# Patient Record
Sex: Male | Born: 1955
Health system: Southern US, Community
[De-identification: ages and names within clinical notes are randomized; demographics above are authoritative.]

## PROBLEM LIST (undated history)

## (undated) DIAGNOSIS — E785 Hyperlipidemia, unspecified: Secondary | ICD-10-CM

## (undated) DIAGNOSIS — F419 Anxiety disorder, unspecified: Secondary | ICD-10-CM

---

## 2016-04-10 DIAGNOSIS — H2513 Age-related nuclear cataract, bilateral: Secondary | ICD-10-CM | POA: Insufficient documentation

## 2016-10-07 DIAGNOSIS — F411 Generalized anxiety disorder: Secondary | ICD-10-CM | POA: Insufficient documentation

## 2016-10-07 DIAGNOSIS — G4700 Insomnia, unspecified: Secondary | ICD-10-CM | POA: Insufficient documentation

## 2019-03-01 DIAGNOSIS — E7800 Pure hypercholesterolemia, unspecified: Secondary | ICD-10-CM | POA: Diagnosis not present

## 2019-03-01 DIAGNOSIS — E663 Overweight: Secondary | ICD-10-CM | POA: Diagnosis not present

## 2019-03-01 DIAGNOSIS — R030 Elevated blood-pressure reading, without diagnosis of hypertension: Secondary | ICD-10-CM | POA: Diagnosis not present

## 2019-03-01 DIAGNOSIS — L821 Other seborrheic keratosis: Secondary | ICD-10-CM | POA: Diagnosis not present

## 2019-09-01 DIAGNOSIS — R030 Elevated blood-pressure reading, without diagnosis of hypertension: Secondary | ICD-10-CM | POA: Diagnosis not present

## 2019-09-01 DIAGNOSIS — E7800 Pure hypercholesterolemia, unspecified: Secondary | ICD-10-CM | POA: Diagnosis not present

## 2019-09-01 DIAGNOSIS — F411 Generalized anxiety disorder: Secondary | ICD-10-CM | POA: Diagnosis not present

## 2019-09-01 DIAGNOSIS — E663 Overweight: Secondary | ICD-10-CM | POA: Diagnosis not present

## 2020-03-05 DIAGNOSIS — F411 Generalized anxiety disorder: Secondary | ICD-10-CM | POA: Diagnosis not present

## 2020-03-05 DIAGNOSIS — U071 COVID-19: Secondary | ICD-10-CM | POA: Diagnosis not present

## 2020-03-05 DIAGNOSIS — Z20822 Contact with and (suspected) exposure to covid-19: Secondary | ICD-10-CM | POA: Diagnosis not present

## 2020-03-05 DIAGNOSIS — R030 Elevated blood-pressure reading, without diagnosis of hypertension: Secondary | ICD-10-CM | POA: Diagnosis not present

## 2020-03-05 DIAGNOSIS — I771 Stricture of artery: Secondary | ICD-10-CM | POA: Diagnosis not present

## 2020-03-05 DIAGNOSIS — I6523 Occlusion and stenosis of bilateral carotid arteries: Secondary | ICD-10-CM | POA: Diagnosis not present

## 2020-03-05 DIAGNOSIS — E663 Overweight: Secondary | ICD-10-CM | POA: Diagnosis not present

## 2020-03-05 DIAGNOSIS — E7800 Pure hypercholesterolemia, unspecified: Secondary | ICD-10-CM | POA: Diagnosis not present

## 2020-03-16 DIAGNOSIS — Z1211 Encounter for screening for malignant neoplasm of colon: Secondary | ICD-10-CM | POA: Diagnosis not present

## 2020-03-16 DIAGNOSIS — Z1212 Encounter for screening for malignant neoplasm of rectum: Secondary | ICD-10-CM | POA: Diagnosis not present

## 2020-05-28 ENCOUNTER — Other Ambulatory Visit: Payer: Self-pay

## 2020-05-28 ENCOUNTER — Encounter (HOSPITAL_COMMUNITY): Payer: Self-pay | Admitting: Pediatrics

## 2020-05-28 ENCOUNTER — Observation Stay (HOSPITAL_COMMUNITY): Payer: PPO

## 2020-05-28 ENCOUNTER — Emergency Department (HOSPITAL_COMMUNITY): Payer: PPO

## 2020-05-28 ENCOUNTER — Observation Stay (HOSPITAL_COMMUNITY)
Admission: EM | Admit: 2020-05-28 | Discharge: 2020-05-29 | Disposition: A | Discharge status: Home / Self Care | Payer: PPO | Attending: Student | Admitting: Student

## 2020-05-28 DIAGNOSIS — F419 Anxiety disorder, unspecified: Secondary | ICD-10-CM | POA: Diagnosis present

## 2020-05-28 DIAGNOSIS — I10 Essential (primary) hypertension: Secondary | ICD-10-CM | POA: Diagnosis not present

## 2020-05-28 DIAGNOSIS — H34231 Retinal artery branch occlusion, right eye: Secondary | ICD-10-CM | POA: Diagnosis not present

## 2020-05-28 DIAGNOSIS — F411 Generalized anxiety disorder: Secondary | ICD-10-CM | POA: Insufficient documentation

## 2020-05-28 DIAGNOSIS — Z20822 Contact with and (suspected) exposure to covid-19: Secondary | ICD-10-CM | POA: Insufficient documentation

## 2020-05-28 DIAGNOSIS — I672 Cerebral atherosclerosis: Secondary | ICD-10-CM | POA: Diagnosis not present

## 2020-05-28 DIAGNOSIS — H43811 Vitreous degeneration, right eye: Secondary | ICD-10-CM | POA: Diagnosis not present

## 2020-05-28 DIAGNOSIS — R29818 Other symptoms and signs involving the nervous system: Secondary | ICD-10-CM | POA: Diagnosis not present

## 2020-05-28 DIAGNOSIS — H53131 Sudden visual loss, right eye: Secondary | ICD-10-CM | POA: Diagnosis not present

## 2020-05-28 DIAGNOSIS — H538 Other visual disturbances: Secondary | ICD-10-CM | POA: Diagnosis present

## 2020-05-28 DIAGNOSIS — H5461 Unqualified visual loss, right eye, normal vision left eye: Secondary | ICD-10-CM | POA: Diagnosis not present

## 2020-05-28 DIAGNOSIS — I618 Other nontraumatic intracerebral hemorrhage: Secondary | ICD-10-CM | POA: Diagnosis not present

## 2020-05-28 DIAGNOSIS — Z79899 Other long term (current) drug therapy: Secondary | ICD-10-CM | POA: Diagnosis not present

## 2020-05-28 DIAGNOSIS — Z87891 Personal history of nicotine dependence: Secondary | ICD-10-CM | POA: Insufficient documentation

## 2020-05-28 DIAGNOSIS — E785 Hyperlipidemia, unspecified: Secondary | ICD-10-CM | POA: Diagnosis present

## 2020-05-28 DIAGNOSIS — H35033 Hypertensive retinopathy, bilateral: Secondary | ICD-10-CM | POA: Diagnosis not present

## 2020-05-28 DIAGNOSIS — I6523 Occlusion and stenosis of bilateral carotid arteries: Secondary | ICD-10-CM | POA: Insufficient documentation

## 2020-05-28 HISTORY — DX: Anxiety disorder, unspecified: F41.9

## 2020-05-28 HISTORY — DX: Hyperlipidemia, unspecified: E78.5

## 2020-05-28 LAB — DIFFERENTIAL
Abs Immature Granulocytes: 0.01 10*3/uL (ref 0.00–0.07)
Basophils Absolute: 0 10*3/uL (ref 0.0–0.1)
Basophils Relative: 1 %
Eosinophils Absolute: 0.2 10*3/uL (ref 0.0–0.5)
Eosinophils Relative: 3 %
Immature Granulocytes: 0 %
Lymphocytes Relative: 36 %
Lymphs Abs: 2.4 10*3/uL (ref 0.7–4.0)
Monocytes Absolute: 0.6 10*3/uL (ref 0.1–1.0)
Monocytes Relative: 8 %
Neutro Abs: 3.4 10*3/uL (ref 1.7–7.7)
Neutrophils Relative %: 52 %

## 2020-05-28 LAB — CBC
HCT: 47 % (ref 39.0–52.0)
Hemoglobin: 14.9 g/dL (ref 13.0–17.0)
MCH: 30.6 pg (ref 26.0–34.0)
MCHC: 31.7 g/dL (ref 30.0–36.0)
MCV: 96.5 fL (ref 80.0–100.0)
Platelets: 239 10*3/uL (ref 150–400)
RBC: 4.87 MIL/uL (ref 4.22–5.81)
RDW: 12.4 % (ref 11.5–15.5)
WBC: 6.6 10*3/uL (ref 4.0–10.5)
nRBC: 0 % (ref 0.0–0.2)

## 2020-05-28 LAB — COMPREHENSIVE METABOLIC PANEL
ALT: 27 U/L (ref 0–44)
AST: 21 U/L (ref 15–41)
Albumin: 4.1 g/dL (ref 3.5–5.0)
Alkaline Phosphatase: 52 U/L (ref 38–126)
Anion gap: 12 (ref 5–15)
BUN: 13 mg/dL (ref 8–23)
CO2: 22 mmol/L (ref 22–32)
Calcium: 9.2 mg/dL (ref 8.9–10.3)
Chloride: 105 mmol/L (ref 98–111)
Creatinine, Ser: 1.09 mg/dL (ref 0.61–1.24)
GFR, Estimated: >60 mL/min (ref >60)
Glucose, Bld: 99 mg/dL (ref 70–99)
Potassium: 4.5 mmol/L (ref 3.5–5.1)
Sodium: 139 mmol/L (ref 135–145)
Total Bilirubin: 0.7 mg/dL (ref 0.3–1.2)
Total Protein: 6.9 g/dL (ref 6.5–8.1)

## 2020-05-28 LAB — I-STAT CHEM 8, ED
BUN: 14 mg/dL (ref 8–23)
Calcium, Ion: 1.17 mmol/L (ref 1.15–1.40)
Chloride: 106 mmol/L (ref 98–111)
Creatinine, Ser: 1 mg/dL (ref 0.61–1.24)
Glucose, Bld: 97 mg/dL (ref 70–99)
HCT: 45 % (ref 39.0–52.0)
Hemoglobin: 15.3 g/dL (ref 13.0–17.0)
Potassium: 4.2 mmol/L (ref 3.5–5.1)
Sodium: 141 mmol/L (ref 135–145)
TCO2: 24 mmol/L (ref 22–32)

## 2020-05-28 LAB — PROTIME-INR
INR: 1 (ref 0.8–1.2)
Prothrombin Time: 12.6 seconds (ref 11.4–15.2)

## 2020-05-28 LAB — APTT: aPTT: 28 seconds (ref 24–36)

## 2020-05-28 LAB — SARS CORONAVIRUS 2 (TAT 6-24 HRS): SARS Coronavirus 2: NEGATIVE

## 2020-05-28 MED ORDER — STROKE: EARLY STAGES OF RECOVERY BOOK: Freq: Once | Status: DC

## 2020-05-28 MED ORDER — SENNOSIDES-DOCUSATE SODIUM 8.6-50 MG PO TABS: 1.0000 | ORAL_TABLET | Freq: Every evening | ORAL | Status: DC | PRN

## 2020-05-28 MED ORDER — SODIUM CHLORIDE 0.9% FLUSH
3.0000 mL | Freq: Once | INTRAVENOUS | Status: AC
  Administered 2020-05-28: 3 mL via INTRAVENOUS

## 2020-05-28 MED ORDER — ACETAMINOPHEN 325 MG PO TABS: 650.0000 mg | ORAL_TABLET | ORAL | Status: DC | PRN

## 2020-05-28 MED ORDER — ACETAMINOPHEN 160 MG/5ML PO SOLN: 650.0000 mg | ORAL | Status: DC | PRN

## 2020-05-28 MED ORDER — ROSUVASTATIN CALCIUM 5 MG PO TABS: 10.0000 mg | ORAL_TABLET | Freq: Every day | ORAL | Status: DC

## 2020-05-28 MED ORDER — ASPIRIN EC 81 MG PO TBEC
81.0000 mg | DELAYED_RELEASE_TABLET | Freq: Every day | ORAL | Status: DC
  Administered 2020-05-29: 81 mg via ORAL
  Filled 2020-05-28: qty 1

## 2020-05-28 MED ORDER — BUPROPION HCL ER (XL) 150 MG PO TB24
300.0000 mg | ORAL_TABLET | Freq: Every day | ORAL | Status: DC
  Administered 2020-05-29: 300 mg via ORAL
  Filled 2020-05-28: qty 2

## 2020-05-28 MED ORDER — ENOXAPARIN SODIUM 40 MG/0.4ML ~~LOC~~ SOLN
40.0000 mg | SUBCUTANEOUS | Status: DC
  Administered 2020-05-28: 40 mg via SUBCUTANEOUS
  Filled 2020-05-28: qty 0.4

## 2020-05-28 MED ORDER — ACETAMINOPHEN 650 MG RE SUPP: 650.0000 mg | RECTAL | Status: DC | PRN

## 2020-05-28 MED ORDER — CLOPIDOGREL BISULFATE 75 MG PO TABS
75.0000 mg | ORAL_TABLET | Freq: Every day | ORAL | Status: DC
  Administered 2020-05-29 (×2): 75 mg via ORAL
  Filled 2020-05-28 (×2): qty 1

## 2020-05-28 MED ORDER — ATORVASTATIN CALCIUM 40 MG PO TABS
40.0000 mg | ORAL_TABLET | Freq: Every day | ORAL | Status: DC
  Administered 2020-05-29: 40 mg via ORAL
  Filled 2020-05-28: qty 1

## 2020-05-28 NOTE — ED Provider Notes (Signed)
[facility] EMERGENCY DEPARTMENT Provider Note   CSN: [id number] Arrival date & time: 05/28/20  1222     History Chief Complaint  Patient presents with  . Visual Field Change    JUANPABLO CIRESI is a 65 y.o. male.  The history is provided by the patient, medical records and the spouse.   ISSAIAH SEABROOKS is a 65 y.o. male who presents to the Emergency Department complaining of visual field change.  He presents to the ED accompanied by his wife for visual field change that started Friday night.  He reports loss of vision in the bottom half of his right eye that is constant in nature. He did not mention this change to his wife until today due to going to boar hunting trip in [place]. Today he went to see his ophthalmologist, Dr. Porfirio Mylar, who referred him to [facility] retina. There he was seen by Dr. Lelan Pons, who diagnosed him with a branch retinal artery occlusion of the right eye and he was referred to the emergency department for further evaluation.  He denies any headache, numbness, weakness, fevers, cough, nausea, vomiting. He has a history of hyperlipidemia. He has been vaccinated for COVID-19 but not boosted. He does take 81 mg aspirin daily.    History reviewed. No pertinent past medical history.  There are no problems to display for this patient.   History reviewed. No pertinent surgical history.     No family history on file.     Home Medications Prior to Admission medications   Not on File    Allergies    Patient has no known allergies.  Review of Systems   Review of Systems  All other systems reviewed and are negative.   Physical Exam Updated Vital Signs BP (!) 158/64   Pulse (!) 59   Temp 98.8 F (37.1 C)   Resp 17   SpO2 97%   Physical Exam Vitals and nursing note reviewed.  Constitutional:      Appearance: He is well-developed and well-nourished.  HENT:     Head: Normocephalic and atraumatic.  Cardiovascular:     Rate  and Rhythm: Normal rate and regular rhythm.     Heart sounds: No murmur heard.   Pulmonary:     Effort: Pulmonary effort is normal. No respiratory distress.     Breath sounds: Normal breath sounds.  Abdominal:     Palpations: Abdomen is soft.     Tenderness: There is no abdominal tenderness. There is no guarding or rebound.  Musculoskeletal:        General: No tenderness or edema.  Skin:    General: Skin is warm and dry.  Neurological:     Mental Status: He is alert and oriented to person, place, and time.     Comments: No asymmetry of facial movements. There is decreased vision in the lower visual fields in the right eye. EOM I. Pupils equal round and reactive. Five out of five strength in all four extremities with sensation light touch intact in all four extremities.  Psychiatric:        Mood and Affect: Mood and affect normal.        Behavior: Behavior normal.     ED Results / Procedures / Treatments   Labs (all labs ordered are listed, but only abnormal results are displayed) Labs Reviewed  PROTIME-INR  APTT  CBC  DIFFERENTIAL  COMPREHENSIVE METABOLIC PANEL  I-STAT CHEM 8, ED  CBG MONITORING, ED  EKG None  Radiology CT HEAD WO CONTRAST  Result Date: 05/28/2020 CLINICAL DATA:  Right-sided vision loss for several days EXAM: CT HEAD WITHOUT CONTRAST TECHNIQUE: Contiguous axial images were obtained from the base of the skull through the vertex without intravenous contrast. COMPARISON:  None. FINDINGS: Brain: No evidence of acute infarction, hemorrhage, hydrocephalus, extra-axial collection or mass lesion/mass effect. Vascular: No hyperdense vessel or unexpected calcification. Skull: Normal. Negative for fracture or focal lesion. Sinuses/Orbits: No acute finding. Other: None. IMPRESSION: No acute intracranial abnormality noted. Electronically Signed   By: Alcide Clever M.D.   On: 05/28/2020 13:58    Procedures Procedures   Medications Ordered in ED Medications  sodium  chloride flush (NS) 0.9 % injection 3 mL (has no administration in time range)    ED Course  I have reviewed the triage vital signs and the nursing notes.  Pertinent labs & imaging results that were available during my care of the patient were reviewed by me and considered in my medical decision making (see chart for details).    MDM Rules/Calculators/A&P                         patient referred to the emergency department by ophthalmology for acute branch retinal artery occlusion that occurred on Friday. Discussed with Dr. Otelia Limes with neurology, who recommends admission for stroke workup. Patient updated of treatment plan. Hospitalist consulted for admission.  Final Clinical Impression(s) / ED Diagnoses Final diagnoses:  Retinal artery branch occlusion of right eye    Rx / DC Orders ED Discharge Orders    None       Tilden Fossa, MD 05/28/20 2010

## 2020-05-28 NOTE — H&P (Addendum)
History and Physical    Fred Hammond NLG:[id number] DOB: [birth year] DOA: 05/28/2020  PCP: Shellia Cleverly, PA  Patient coming from: Ophthalmology office  I have personally briefly reviewed patient's old medical records in [facility]  Chief Complaint: Right visual field change  HPI: Fred Hammond is a 65 y.o. male with medical history significant for hyperlipidemia and anxiety who presents to the ED for evaluation of right visual field change.  Patient states he was in his usual state of health when on 05/25/2020 he noticed a new loss of vision in his right lower visual field.  He did not really think much of it at the time.  The following day he went boar hunting and when he raised his gun up to look through the scope he again noticed that he had a loss of vision in the right lower field which he describes as a circular area that is blurred out.  He has not had any associated pain, headache, nausea, vomiting, chest pain, palpitations, dyspnea, numbness/tingling, weakness in his arms or legs.  He went to his regular ophthalmologist earlier today who sent him to [place] retina.  He was seen by Dr. Lelan Pons who diagnosed him with a branch retinal artery occlusion of the right eye and referred him to the ED for further evaluation.  Patient states he is a former smoker, quit in 2010.  Reports rare alcohol use denies any illicit drug use.  He reports a history of hypertension and diabetes in his mother.  His current medications are rosuvastatin 10 mg daily, aspirin 81 mg daily, bupropion, and montelukast.  ED Course:  Initial vitals showed BP 116/88, pulse 59, RR 16, SPO2 96% on room air.  Labs show WBC 6.6, hemoglobin 14.9, platelets 239,000, sodium 139, potassium 4.5, bicarb 22, BUN 13, creatinine 1.09, LFTs within normal limits.  CT head without contrast was negative for acute intracranial abnormality.  Per EDP, case was discussed with on-call neurology, Dr. Otelia Limes, who recommended  medical admission for stroke work-up.  The hospitalist service was consulted to admit for further evaluation and management.  Review of Systems: All systems reviewed and are negative except as documented in history of present illness above.   Past Medical History:  Diagnosis Date  . Anxiety   . Hyperlipidemia     History reviewed. No pertinent surgical history.  Social History:  reports that he has quit smoking. He has never used smokeless tobacco. He reports previous alcohol use. He reports that he does not use drugs.  No Known Allergies  Family History  Problem Relation Age of Onset  . Hypertension Mother   . Diabetes Mother      Prior to Admission medications   Not on File    Physical Exam: Vitals:   05/28/20 1930 05/28/20 1945 05/28/20 2000 05/28/20 2040  BP: (!) 166/73 (!) 166/72 (!) 148/76 (!) 169/72  Pulse: (!) 57 (!) 51 (!) 51 (!) 58  Resp: 14 13 17 16   Temp:      SpO2: 99% 100% 98% 99%   Constitutional: NAD, calm, comfortable Eyes: PERRL, EOMI, lids and conjunctivae normal.  Visual fields largely intact exception of scotoma right lower visual field. ENMT: Mucous membranes are moist. Posterior pharynx clear of any exudate or lesions.Normal dentition.  Neck: normal, supple, no masses. Respiratory: clear to auscultation bilaterally, no wheezing, no crackles. Normal respiratory effort. No accessory muscle use.  Cardiovascular: Regular rate and rhythm, no murmurs / rubs / gallops. No extremity edema.  2+ pedal pulses. Abdomen: no tenderness, no masses palpated. No hepatosplenomegaly. Bowel sounds positive.  Musculoskeletal: no clubbing / cyanosis. No joint deformity upper and lower extremities. Good ROM, no contractures. Normal muscle tone.  Skin: no rashes, lesions, ulcers. No induration Neurologic: CN 2-12 grossly intact. Sensation intact. Strength 5/5 in all 4.  Psychiatric: Normal judgment and insight. Alert and oriented x 3. Normal mood.   Labs on Admission:  I have personally reviewed following labs and imaging studies  CBC: Recent Labs  Lab 05/28/20 1256 05/28/20 1317  WBC  --  6.6  NEUTROABS  --  3.4  HGB 15.3 14.9  HCT 45.0 47.0  MCV  --  96.5  PLT  --  239   Basic Metabolic Panel: Recent Labs  Lab 05/28/20 1256 05/28/20 1317  NA 141 139  K 4.2 4.5  CL 106 105  CO2  --  22  GLUCOSE 97 99  BUN 14 13  CREATININE 1.00 1.09  CALCIUM  --  9.2   GFR: CrCl cannot be calculated (Unknown ideal weight.). Liver Function Tests: Recent Labs  Lab 05/28/20 1317  AST 21  ALT 27  ALKPHOS 52  BILITOT 0.7  PROT 6.9  ALBUMIN 4.1   No results for input(s): LIPASE, AMYLASE in the last 168 hours. No results for input(s): AMMONIA in the last 168 hours. Coagulation Profile: Recent Labs  Lab 05/28/20 1317  INR 1.0   Cardiac Enzymes: No results for input(s): CKTOTAL, CKMB, CKMBINDEX, TROPONINI in the last 168 hours. BNP (last 3 results) No results for input(s): PROBNP in the last 8760 hours. HbA1C: No results for input(s): HGBA1C in the last 72 hours. CBG: No results for input(s): GLUCAP in the last 168 hours. Lipid Profile: No results for input(s): CHOL, HDL, LDLCALC, TRIG, CHOLHDL, LDLDIRECT in the last 72 hours. Thyroid Function Tests: No results for input(s): TSH, T4TOTAL, FREET4, T3FREE, THYROIDAB in the last 72 hours. Anemia Panel: No results for input(s): VITAMINB12, FOLATE, FERRITIN, TIBC, IRON, RETICCTPCT in the last 72 hours. Urine analysis: No results found for: COLORURINE, APPEARANCEUR, LABSPEC, PHURINE, GLUCOSEU, HGBUR, BILIRUBINUR, KETONESUR, PROTEINUR, UROBILINOGEN, NITRITE, LEUKOCYTESUR  Radiological Exams on Admission: CT HEAD WO CONTRAST  Result Date: 05/28/2020 CLINICAL DATA:  Right-sided vision loss for several days EXAM: CT HEAD WITHOUT CONTRAST TECHNIQUE: Contiguous axial images were obtained from the base of the skull through the vertex without intravenous contrast. COMPARISON:  None. FINDINGS: Brain: No  evidence of acute infarction, hemorrhage, hydrocephalus, extra-axial collection or mass lesion/mass effect. Vascular: No hyperdense vessel or unexpected calcification. Skull: Normal. Negative for fracture or focal lesion. Sinuses/Orbits: No acute finding. Other: None. IMPRESSION: No acute intracranial abnormality noted. Electronically Signed   By: Alcide Clever M.D.   On: 05/28/2020 13:58    EKG: Personally reviewed. Sinus rhythm without acute ischemic changes.  No prior for comparison.  Assessment/Plan Principal Problem:   Branch retinal artery occlusion of right eye Active Problems:   Acute loss of vision, right   Anxiety   Hyperlipidemia  Fred Hammond is a 65 y.o. male with medical history significant for hyperlipidemia and anxiety who is admitted for CVA work-up in setting of branch retinal artery occlusion diagnosis.  Acute right visual field deficit due to branch retinal artery occlusion: Diagnosed with branch retinal artery occlusion by ophthalmology, Dr. Lelan Pons, as an outpatient and sent to ED for CVA work-up. -Obtain MRI brain without contrast -CTA head/neck -Echocardiogram -Continue aspirin 81 mg daily -Add Plavix 75 mg daily -Check A1c, lipid  panel -Rosuvastatin switch to atorvastatin 40 mg daily -Monitor on telemetry -Stroke team to follow -Follow-up with ophthalmology as an outpatient  Hyperlipidemia: Continue atorvastatin.  Generalized anxiety disorder: Continue bupropion.  DVT prophylaxis: Lovenox Code Status: DNR, confirmed with patient Family Communication: Discussed with patient, he has discussed with family Disposition Plan: From home and likely discharge to home pending further CVA work-up Consults called: Neurology Level of care: Telemetry Medical Admission status:  Status is: Observation  The patient remains OBS appropriate and will d/c before 2 midnights.  Dispo: The patient is from: Home              Anticipated d/c is to: Home               Anticipated d/c date is: 1 day              Patient currently is not medically stable to d/c.   Difficult to place patient No   Darreld Mclean MD [facility]  If 7PM-7AM, please contact night-coverage [email or web address]  05/28/2020, 10:36 PM

## 2020-05-28 NOTE — Consult Note (Signed)
NEURO HOSPITALIST CONSULT NOTE   Requestig physician: Dr. Allena Katz  Reason for Consult: Acute right monocular partial vision loss  History obtained from:   Patient and Chart     HPI:                                                                                                                                          Fred Hammond is an 65 y.o. male with a PMHx of HLD who experienced partial vision loss in his right eye on Friday; the vision loss was central and in the lower half of his field of vision. He had already made plans to [place] in [facility] for bo[place]-hunting, so he decided not to be seen by a physician initially. However, when he tried to hunt using his right eye (his hunting eye) he was completely disabled regarding this. He then went to see an Ophthalmologist, who diagnosed him with a right BRAO. He was then sent to the ED for stroke work up. He has not had any confusion, facial droop, aphasia, dysarthria, dizziness, weakness or sensory loss. He has no prior history of stroke. He states that he takes ASA regularly.   Past Medical History:  Diagnosis Date  . Anxiety   . Hyperlipidemia     History reviewed. No pertinent surgical history.  Family History  Problem Relation Age of Onset  . Hypertension Mother   . Diabetes Mother               Social History:  reports that he has quit smoking. He has never used smokeless tobacco. He reports previous alcohol use. He reports that he does not use drugs.  No Known Allergies  MEDICATIONS:                                                                                                                     ASA QD   ROS:  As per HPI. Comprehensive ROS otherwise negative.    Blood pressure (!) 169/72, pulse (!) 58, temperature 98.8 F (37.1 C), resp. rate 16, SpO2 99  %.   General Examination:                                                                                                       Physical Exam  HEENT-  [place]/AT   Lungs- Respirations unlabored Extremities- No edema  Neurological Examination Mental Status: Alert, fully oriented, thought content appropriate.  Speech fluent without evidence of aphasia.  Able to follow all commands without difficulty. Cranial Nerves: II: Pupils 2 mm and equally reactive bilaterally. Unable to adequately visualize fundi with ophthalmoscope due to small pupils. Testing of visual fields reveals central scotoma below the horizontal meridian on the right. Left eye visual fields are normal.  III,IV, VI: No ptosis. EOMI. No nystagmus.   V,VII: Smile symmetric, facial temp sensation equal bilaterally VIII: hearing intact to voice IX,X: No hypophonia XI: Symmetric XII: Midline tongue extension Motor: Right : Upper extremity   5/5    Left:     Upper extremity   5/5  Lower extremity   5/5     Lower extremity   5/5 No pronator drift Sensory: Temp and light touch sensation intact throughout, bilaterally. No extinction to DSS.  Deep Tendon Reflexes: 2+ bilateral brachioradialis and biceps. 3+ bilateral patellae. Toes downgoing bilaterally.  Cerebellar: No ataxia with FNF bilaterally  Gait: Deferred   Lab Results: Basic Metabolic Panel: Recent Labs  Lab 05/28/20 1256 05/28/20 1317  NA 141 139  K 4.2 4.5  CL 106 105  CO2  --  22  GLUCOSE 97 99  BUN 14 13  CREATININE 1.00 1.09  CALCIUM  --  9.2    CBC: Recent Labs  Lab 05/28/20 1256 05/28/20 1317  WBC  --  6.6  NEUTROABS  --  3.4  HGB 15.3 14.9  HCT 45.0 47.0  MCV  --  96.5  PLT  --  239    Cardiac Enzymes: No results for input(s): CKTOTAL, CKMB, CKMBINDEX, TROPONINI in the last 168 hours.  Lipid Panel: No results for input(s): CHOL, TRIG, HDL, CHOLHDL, VLDL, LDLCALC in the last 168 hours.  Imaging: CT HEAD WO CONTRAST  Result Date:  05/28/2020 CLINICAL DATA:  Right-sided vision loss for several days EXAM: CT HEAD WITHOUT CONTRAST TECHNIQUE: Contiguous axial images were obtained from the base of the skull through the vertex without intravenous contrast. COMPARISON:  None. FINDINGS: Brain: No evidence of acute infarction, hemorrhage, hydrocephalus, extra-axial collection or mass lesion/mass effect. Vascular: No hyperdense vessel or unexpected calcification. Skull: Normal. Negative for fracture or focal lesion. Sinuses/Orbits: No acute finding. Other: None. IMPRESSION: No acute intracranial abnormality noted. Electronically Signed   By: Alcide Clever M.D.   On: 05/28/2020 13:58    Assessment: 65 year old male presenting with right branch retinal artery occlusion (BRAO) 1.Exam reveals central scotoma of right eye. Vision in left eye is normal. DDx for underlying etiology of BRAO includes carotid atherosclerosis, cardiac valvular vegetation, large  PFO and intermittent atrial fibrillation 2. CT head without acute intracranial abnormality 3. Currently taking ASA as an outpatient.   Recommendations: 1. Add Plavix to ASA 2. Full stroke work up to include MRI brain, CTA of head and neck, TTE and cardiac telemetry 3. If above work up is negative, will most likely need loop recorder placement 4. Switch rosuvastatin to atorvastatin 40 mg po qd.  5. Will need Ophthalmology outpatient follow up 6. Stroke Team to follow in AM.     Electronically signed: Dr. Caryl Pina 05/28/2020, 10:09 PM

## 2020-05-28 NOTE — ED Triage Notes (Signed)
Reported partial vision loss since Friday, and was referred by ophthalmology for CVA work up. Denies other symptoms, A&ox4; ambulatory.

## 2020-05-29 ENCOUNTER — Other Ambulatory Visit: Payer: Self-pay | Admitting: Physician Assistant

## 2020-05-29 ENCOUNTER — Encounter: Payer: Self-pay | Admitting: *Deleted

## 2020-05-29 ENCOUNTER — Observation Stay (HOSPITAL_BASED_OUTPATIENT_CLINIC_OR_DEPARTMENT_OTHER): Payer: PPO

## 2020-05-29 DIAGNOSIS — I6523 Occlusion and stenosis of bilateral carotid arteries: Secondary | ICD-10-CM

## 2020-05-29 DIAGNOSIS — I672 Cerebral atherosclerosis: Secondary | ICD-10-CM | POA: Diagnosis not present

## 2020-05-29 DIAGNOSIS — H349 Unspecified retinal vascular occlusion: Secondary | ICD-10-CM | POA: Diagnosis not present

## 2020-05-29 DIAGNOSIS — E785 Hyperlipidemia, unspecified: Secondary | ICD-10-CM | POA: Diagnosis not present

## 2020-05-29 DIAGNOSIS — H53131 Sudden visual loss, right eye: Secondary | ICD-10-CM

## 2020-05-29 DIAGNOSIS — F419 Anxiety disorder, unspecified: Secondary | ICD-10-CM | POA: Diagnosis not present

## 2020-05-29 DIAGNOSIS — H34231 Retinal artery branch occlusion, right eye: Secondary | ICD-10-CM

## 2020-05-29 DIAGNOSIS — H5461 Unqualified visual loss, right eye, normal vision left eye: Secondary | ICD-10-CM | POA: Diagnosis not present

## 2020-05-29 DIAGNOSIS — R29818 Other symptoms and signs involving the nervous system: Secondary | ICD-10-CM | POA: Diagnosis not present

## 2020-05-29 DIAGNOSIS — I618 Other nontraumatic intracerebral hemorrhage: Secondary | ICD-10-CM | POA: Diagnosis not present

## 2020-05-29 LAB — LIPID PANEL
Cholesterol: 154 mg/dL (ref 0–200)
HDL: 36 mg/dL — ABNORMAL LOW (ref >40)
LDL Cholesterol: 71 mg/dL (ref 0–99)
Total CHOL/HDL Ratio: 4.3 RATIO
Triglycerides: 235 mg/dL — ABNORMAL HIGH (ref <150)
VLDL: 47 mg/dL — ABNORMAL HIGH (ref 0–40)

## 2020-05-29 LAB — HIV ANTIBODY (ROUTINE TESTING W REFLEX): HIV Screen 4th Generation wRfx: NONREACTIVE

## 2020-05-29 LAB — HEMOGLOBIN A1C
Hgb A1c MFr Bld: 5.9 % — ABNORMAL HIGH (ref 4.8–5.6)
Mean Plasma Glucose: 122.63 mg/dL

## 2020-05-29 LAB — ECHOCARDIOGRAM COMPLETE
Area-P 1/2: 2.24 cm2
S' Lateral: 3.7 cm

## 2020-05-29 LAB — CK: Total CK: 188 U/L (ref 49–397)

## 2020-05-29 MED ORDER — IOHEXOL 350 MG/ML SOLN
80.0000 mL | Freq: Once | INTRAVENOUS | Status: AC | PRN
  Administered 2020-05-29: 80 mL via INTRAVENOUS

## 2020-05-29 MED ORDER — ROSUVASTATIN CALCIUM 20 MG PO TABS: 20.0000 mg | ORAL_TABLET | Freq: Every day | ORAL | 1 refills | Status: AC

## 2020-05-29 MED ORDER — CLOPIDOGREL BISULFATE 75 MG PO TABS: 75.0000 mg | ORAL_TABLET | Freq: Every day | ORAL | 0 refills | Status: DC

## 2020-05-29 NOTE — ED Notes (Signed)
Pt cleared on Vascular side of things

## 2020-05-29 NOTE — Progress Notes (Signed)
Pt and his wife are in agreement that pt is moving well, and has no issues to have PT work with for now.  He and his wife are aware that if he notices weakness then he will ask nursing to send PT back.  Discharge PT for now.  05/29/20 1200  PT Visit Information  Reason Eval/Treat Not Completed Other (comment)    Samul Dada, PT MS Acute Rehab Dept. Number: [facility] [id number] and [facility] [phone]

## 2020-05-29 NOTE — Progress Notes (Signed)
  Echocardiogram 2D Echocardiogram has been performed.  Sira Adsit G Takari Lundahl 05/29/2020, 8:30 AM

## 2020-05-29 NOTE — Progress Notes (Signed)
Patient ID: Fred Hammond, male   DOB: Sep 25, 1955, 65 y.o.   MRN: [id number] Patient enrolled for [employer] to ship a 30 day cardiac event monitor to his home. Letter with instructions mailed to patient.

## 2020-05-29 NOTE — Progress Notes (Signed)
Carotid duplex has been completed.   Preliminary results in CV Proc.   Blanch Media 05/29/2020 11:00 AM

## 2020-05-29 NOTE — ED Notes (Addendum)
PT DOES NOT WANT TO BE DNR. DR GONFA NOTIFIED.

## 2020-05-29 NOTE — ED Notes (Signed)
Lunch Tray Ordered @ 1036. 

## 2020-05-29 NOTE — Progress Notes (Signed)
e

## 2020-05-29 NOTE — Discharge Summary (Signed)
Physician Discharge Summary  [name] FXO:[id number] DOB: 1956/02/08 DOA: 05/28/2020  PCP: Shellia Cleverly, PA  Admit date: 05/28/2020 Discharge date: 05/29/2020  Admitted From: Home Disposition: Home  Recommendations for Outpatient Follow-up:  1. Follow ups as below. 2. Please obtain CBC/BMP/Mag at follow up 3. Please follow up on the following pending results: None  Home Health: None required Equipment/Devices: None required  Discharge Condition: Stable CODE STATUS: Full code   Follow-up Information    Shellia Cleverly, PA. Schedule an appointment as soon as possible for a visit in 1 week(s).   Specialty: General Practice Contact information: [address] [place] [address] [phone]                Hospital Course: 65 year old male with PMH of anxiety and HLD presenting with acute visual loss in the right lower visual field.  He was initially seen by his ophthalmologist and sent to [facility] retina.  He was diagnosed with right retinal artery branch occlusion, and directed to ED for further evaluation.  In the ED, vitals and basic labs within normal.  CT head without contrast.  CTA neck and head without intracranial arterial occlusion or high-grade stenosis but bilateral carotid bifurcation atherosclerosis.  Right carotid Doppler with no significant stenosis.  CTA neck and carotid Doppler reviewed by vascular surgery, Dr. Edilia Bo who recommended outpatient follow-up in 3 months with repeat carotid Doppler.  MRI brain and echocardiogram without significant finding.  TC 154.  LDL HDL 36.  LDL 71.  TG 235.  A1c 5.9%.  Neurology "sent message to EP" for event monitor.  He has been cleared for discharge on Plavix and aspirin for 3 weeks followed by aspirin alone.  We also increased his Crestor from 10 to 20 mg daily.  See individual problem list below for more on hospital course.  Discharge Diagnoses:  Right retinal artery branch occlusion with vision loss in the right  lower visual field-work-up as above. -Plavix and aspirin for 3 weeks followed by aspirin alone -Increase Crestor to 20 mg daily -Event monitor by EP outpatient-neurology contacting EP -Outpatient follow-up with vascular surgery in 3 months  Bilateral carotid artery atherosclerosis -Management as above  Hyperlipidemia: Lipid panel as above. -Increase Crestor to 20 mg daily  Generalized anxiety?  Appears he is on Wellbutrin which might not be a great choice -Defer to his PCP   There is no height or weight on file to calculate BMI.            Discharge Exam: Vitals:   05/29/20 1200 05/29/20 1400  BP: 136/76 (!) 161/83  Pulse: 60 66  Resp: 15 (!) 21  Temp:    SpO2: 95% 96%    GENERAL: No apparent distress.  Nontoxic. HEENT: MMM.  Hearing grossly intact.  Right lower visual defect NECK: Supple.  No apparent JVD.  RESP:  No IWOB.  Fair aeration bilaterally. CVS:  RRR. Heart sounds normal.  ABD/GI/GU: Bowel sounds present. Soft. Non tender.  MSK/EXT:  Moves extremities. No apparent deformity. No edema.  SKIN: no apparent skin lesion or wound NEURO: Awake, alert and oriented appropriately.  Right lower visual defect.  Otherwise intact. PSYCH: Calm. Normal affect.  Discharge Instructions  Discharge Instructions    Diet - low sodium heart healthy   Complete by: As directed    Discharge instructions   Complete by: As directed    It has been a pleasure taking care of you!  You were hospitalized  due  to vision loss in the right lower visual field likely due to occlusion of of the blood vessels to the corresponding area in your eye.  We have increased your cholesterol medication to reduce your future risk of such problems or stroke.  We have also started you on Plavix (blood thinner) that you take along low-dose aspirin over the next 3 weeks.  Please follow-up with your primary care doctor in 1 to 2 weeks. Cardiology and vascular surgery team will reach out to you to arrange  outpatient follow-up for further evaluation of your heart and the blood vessels in your neck.    Take care,   Increase activity slowly   Complete by: As directed      Allergies as of 05/29/2020      Reactions   Amoxicillin Other (See Comments)      Medication List    STOP taking these medications   ibuprofen 200 MG tablet Commonly known as: ADVIL     TAKE these medications   acetaminophen 500 MG tablet Commonly known as: TYLENOL Take 500 mg by mouth every 6 (six) hours as needed for mild pain or headache.   aspirin 81 MG EC tablet Take 81 mg by mouth daily.   buPROPion 300 MG 24 hr tablet Commonly known as: WELLBUTRIN XL Take 300 mg by mouth daily.   clopidogrel 75 MG tablet Commonly known as: PLAVIX Take 1 tablet (75 mg total) by mouth daily. Start taking on: May 30, 2020   montelukast 10 MG tablet Commonly known as: SINGULAIR Take 10 mg by mouth daily.   rosuvastatin 20 MG tablet Commonly known as: CRESTOR Take 1 tablet (20 mg total) by mouth daily. What changed:   medication strength  how much to take       Consultations:  Neurology  Electrophysiology  Vascular surgery  Procedures/Studies:  2D Echo on 05/29/2020 1. Left ventricular ejection fraction, by estimation, is 55 to 60%. The  left ventricle has normal function. The left ventricle has no regional  wall motion abnormalities. Left ventricular diastolic parameters are  consistent with Grade I diastolic  dysfunction (impaired relaxation).  2. Right ventricular systolic function is normal. The right ventricular  size is normal.  3. The mitral valve is normal in structure. No evidence of mitral valve  regurgitation. No evidence of mitral stenosis. Moderate mitral annular  calcification.  4. The aortic valve is tricuspid. Aortic valve regurgitation is not  visualized. Mild aortic valve sclerosis is present, with no evidence of  aortic valve stenosis.  5. The inferior vena cava is  normal in size with greater than 50%  respiratory variability, suggesting right atrial pressure of 3 mmHg.    CT ANGIO HEAD W OR WO CONTRAST  Result Date: 05/29/2020 CLINICAL DATA:  Right eye vision loss EXAM: CT ANGIOGRAPHY HEAD AND NECK TECHNIQUE: Multidetector CT imaging of the head and neck was performed using the standard protocol during bolus administration of intravenous contrast. Multiplanar CT image reconstructions and MIPs were obtained to evaluate the vascular anatomy. Carotid stenosis measurements (when applicable) are obtained utilizing NASCET criteria, using the distal internal carotid diameter as the denominator. CONTRAST:  80mL OMNIPAQUE IOHEXOL 350 MG/ML SOLN COMPARISON:  None. FINDINGS: CTA NECK FINDINGS SKELETON: There is no bony spinal canal stenosis. No lytic or blastic lesion. OTHER NECK: Normal pharynx, larynx and major salivary glands. No cervical lymphadenopathy. Unremarkable thyroid gland. UPPER CHEST: No pneumothorax or pleural effusion. No nodules or masses. AORTIC ARCH: There is calcific atherosclerosis of  the aortic arch. There is no aneurysm, dissection or hemodynamically significant stenosis of the visualized portion of the aorta. Conventional 3 vessel aortic branching pattern. The visualized proximal subclavian arteries are widely patent. RIGHT CAROTID SYSTEM: No dissection, occlusion or aneurysm. There is mixed density atherosclerosis extending into the proximal ICA, resulting in less than 50% stenosis. LEFT CAROTID SYSTEM: No dissection, occlusion or aneurysm. Mild atherosclerotic calcification at the carotid bifurcation without hemodynamically significant stenosis. VERTEBRAL ARTERIES: Right dominant configuration. Both origins are clearly patent. There is no dissection, occlusion or flow-limiting stenosis to the skull base (V1-V3 segments). CTA HEAD FINDINGS POSTERIOR CIRCULATION: --Vertebral arteries: Normal V4 segments. --Inferior cerebellar arteries: Normal. --Basilar  artery: Normal. --Superior cerebellar arteries: Normal. --Posterior cerebral arteries (PCA): Normal. ANTERIOR CIRCULATION: --Intracranial internal carotid arteries: Atherosclerotic calcification of the internal carotid arteries at the skull base without hemodynamically significant stenosis. --Anterior cerebral arteries (ACA): Normal. Both A1 segments are present. Patent anterior communicating artery (a-comm). --Middle cerebral arteries (MCA): Normal. VENOUS SINUSES: As permitted by contrast timing, patent. ANATOMIC VARIANTS: Fetal origin of the right posterior cerebral artery. Review of the MIP images confirms the above findings. IMPRESSION: 1. No intracranial arterial occlusion or high-grade stenosis. 2. Bilateral carotid bifurcation atherosclerosis without hemodynamically significant stenosis by NASCET criteria. Aortic Atherosclerosis (ICD10-I70.0). Electronically Signed   By: Deatra Robinson M.D.   On: 05/29/2020 00:30   CT HEAD WO CONTRAST  Result Date: 05/28/2020 CLINICAL DATA:  Right-sided vision loss for several days EXAM: CT HEAD WITHOUT CONTRAST TECHNIQUE: Contiguous axial images were obtained from the base of the skull through the vertex without intravenous contrast. COMPARISON:  None. FINDINGS: Brain: No evidence of acute infarction, hemorrhage, hydrocephalus, extra-axial collection or mass lesion/mass effect. Vascular: No hyperdense vessel or unexpected calcification. Skull: Normal. Negative for fracture or focal lesion. Sinuses/Orbits: No acute finding. Other: None. IMPRESSION: No acute intracranial abnormality noted. Electronically Signed   By: Alcide Clever M.D.   On: 05/28/2020 13:58   CT ANGIO NECK W OR WO CONTRAST  Result Date: 05/29/2020 CLINICAL DATA:  Right eye vision loss EXAM: CT ANGIOGRAPHY HEAD AND NECK TECHNIQUE: Multidetector CT imaging of the head and neck was performed using the standard protocol during bolus administration of intravenous contrast. Multiplanar CT image reconstructions  and MIPs were obtained to evaluate the vascular anatomy. Carotid stenosis measurements (when applicable) are obtained utilizing NASCET criteria, using the distal internal carotid diameter as the denominator. CONTRAST:  29mL OMNIPAQUE IOHEXOL 350 MG/ML SOLN COMPARISON:  None. FINDINGS: CTA NECK FINDINGS SKELETON: There is no bony spinal canal stenosis. No lytic or blastic lesion. OTHER NECK: Normal pharynx, larynx and major salivary glands. No cervical lymphadenopathy. Unremarkable thyroid gland. UPPER CHEST: No pneumothorax or pleural effusion. No nodules or masses. AORTIC ARCH: There is calcific atherosclerosis of the aortic arch. There is no aneurysm, dissection or hemodynamically significant stenosis of the visualized portion of the aorta. Conventional 3 vessel aortic branching pattern. The visualized proximal subclavian arteries are widely patent. RIGHT CAROTID SYSTEM: No dissection, occlusion or aneurysm. There is mixed density atherosclerosis extending into the proximal ICA, resulting in less than 50% stenosis. LEFT CAROTID SYSTEM: No dissection, occlusion or aneurysm. Mild atherosclerotic calcification at the carotid bifurcation without hemodynamically significant stenosis. VERTEBRAL ARTERIES: Right dominant configuration. Both origins are clearly patent. There is no dissection, occlusion or flow-limiting stenosis to the skull base (V1-V3 segments). CTA HEAD FINDINGS POSTERIOR CIRCULATION: --Vertebral arteries: Normal V4 segments. --Inferior cerebellar arteries: Normal. --Basilar artery: Normal. --Superior cerebellar arteries: Normal. --Posterior cerebral arteries (PCA):  Normal. ANTERIOR CIRCULATION: --Intracranial internal carotid arteries: Atherosclerotic calcification of the internal carotid arteries at the skull base without hemodynamically significant stenosis. --Anterior cerebral arteries (ACA): Normal. Both A1 segments are present. Patent anterior communicating artery (a-comm). --Middle cerebral  arteries (MCA): Normal. VENOUS SINUSES: As permitted by contrast timing, patent. ANATOMIC VARIANTS: Fetal origin of the right posterior cerebral artery. Review of the MIP images confirms the above findings. IMPRESSION: 1. No intracranial arterial occlusion or high-grade stenosis. 2. Bilateral carotid bifurcation atherosclerosis without hemodynamically significant stenosis by NASCET criteria. Aortic Atherosclerosis (ICD10-I70.0). Electronically Signed   By: Deatra Robinson M.D.   On: 05/29/2020 00:30   MR BRAIN WO CONTRAST  Result Date: 05/28/2020 CLINICAL DATA:  Acute neurologic deficit EXAM: MRI HEAD WITHOUT CONTRAST TECHNIQUE: Multiplanar, multiecho pulse sequences of the brain and surrounding structures were obtained without intravenous contrast. COMPARISON:  None. FINDINGS: Brain: No acute infarct, mass effect or extra-axial collection. Single focus of chronic microhemorrhage near the posterior right lentiform nucleus. There is multifocal hyperintense T2-weighted signal within the white matter. Parenchymal volume and CSF spaces are normal. the midline structures are normal. Vascular: Major flow voids are preserved. Skull and upper cervical spine: Normal calvarium and skull base. Visualized upper cervical spine and soft tissues are normal. Sinuses/Orbits:No paranasal sinus fluid levels or advanced mucosal thickening. No mastoid or middle ear effusion. Normal orbits. IMPRESSION: 1. No acute intracranial abnormality. 2. White matter changes that may be associated with migraines or chronic small vessel ischemia. Electronically Signed   By: Deatra Robinson M.D.   On: 05/28/2020 22:42   ECHOCARDIOGRAM COMPLETE  Result Date: 05/29/2020    ECHOCARDIOGRAM REPORT   Patient Name:   Fred KOREN Date of Exam: 05/29/2020 Medical Rec #:  [id number]       Height:       68.0 in Accession #:    [id number]      Weight:       176.0 lb Date of Birth:  [birth year]       BSA:          1.936 m Patient Age:    64 years        BP:            148/80 mmHg Patient Gender: M               HR:           62 bpm. Exam Location:  Inpatient Procedure: 2D Echo, Cardiac Doppler and Color Doppler Indications:    Branch Renal Artery Occlusion [id number]  History:        Patient has no prior history of Echocardiogram examinations.                 Risk Factors:Dyslipidemia.  Sonographer:    Elmarie Shiley Dance Referring Phys: [id number] VISHAL R PATEL IMPRESSIONS  1. Left ventricular ejection fraction, by estimation, is 55 to 60%. The left ventricle has normal function. The left ventricle has no regional wall motion abnormalities. Left ventricular diastolic parameters are consistent with Grade I diastolic dysfunction (impaired relaxation).  2. Right ventricular systolic function is normal. The right ventricular size is normal.  3. The mitral valve is normal in structure. No evidence of mitral valve regurgitation. No evidence of mitral stenosis. Moderate mitral annular calcification.  4. The aortic valve is tricuspid. Aortic valve regurgitation is not visualized. Mild aortic valve sclerosis is present, with no evidence of aortic valve stenosis.  5. The inferior vena cava is normal in size with  greater than 50% respiratory variability, suggesting right atrial pressure of 3 mmHg. FINDINGS  Left Ventricle: Left ventricular ejection fraction, by estimation, is 55 to 60%. The left ventricle has normal function. The left ventricle has no regional wall motion abnormalities. The left ventricular internal cavity size was normal in size. There is  no left ventricular hypertrophy. Left ventricular diastolic parameters are consistent with Grade I diastolic dysfunction (impaired relaxation). Right Ventricle: The right ventricular size is normal. Right ventricular systolic function is normal. Left Atrium: Left atrial size was normal in size. Right Atrium: Right atrial size was normal in size. Pericardium: There is no evidence of pericardial effusion. Mitral Valve: The mitral valve is normal  in structure. Moderate mitral annular calcification. No evidence of mitral valve regurgitation. No evidence of mitral valve stenosis. Tricuspid Valve: The tricuspid valve is normal in structure. Tricuspid valve regurgitation is trivial. No evidence of tricuspid stenosis. Aortic Valve: The aortic valve is tricuspid. Aortic valve regurgitation is not visualized. Mild aortic valve sclerosis is present, with no evidence of aortic valve stenosis. Pulmonic Valve: The pulmonic valve was normal in structure. Pulmonic valve regurgitation is not visualized. No evidence of pulmonic stenosis. Aorta: The aortic root is normal in size and structure. Venous: The inferior vena cava is normal in size with greater than 50% respiratory variability, suggesting right atrial pressure of 3 mmHg. IAS/Shunts: No atrial level shunt detected by color flow Doppler.  LEFT VENTRICLE PLAX 2D LVIDd:         5.00 cm  Diastology LVIDs:         3.70 cm  LV e' medial:    6.09 cm/s LV PW:         1.10 cm  LV E/e' medial:  9.4 LV IVS:        0.90 cm  LV e' lateral:   7.18 cm/s LVOT diam:     1.90 cm  LV E/e' lateral: 8.0 LV SV:         41 LV SV Index:   21 LVOT Area:     2.84 cm  RIGHT VENTRICLE             IVC RV Basal diam:  2.50 cm     IVC diam: 1.60 cm RV S prime:     16.30 cm/s TAPSE (M-mode): 2.7 cm LEFT ATRIUM             Index       RIGHT ATRIUM           Index LA diam:        4.00 cm 2.07 cm/m  RA Area:     14.80 cm LA Vol (A2C):   59.2 ml 30.58 ml/m RA Volume:   33.90 ml  17.51 ml/m LA Vol (A4C):   24.5 ml 12.66 ml/m LA Biplane Vol: 41.0 ml 21.18 ml/m  AORTIC VALVE LVOT Vmax:   71.80 cm/s LVOT Vmean:  48.400 cm/s LVOT VTI:    0.146 m  AORTA Ao Root diam: 3.50 cm Ao Asc diam:  3.60 cm MITRAL VALVE MV Area (PHT): 2.24 cm    SHUNTS MV Decel Time: 338 msec    Systemic VTI:  0.15 m MV E velocity: 57.40 cm/s  Systemic Diam: 1.90 cm MV A velocity: 85.30 cm/s MV E/A ratio:  0.67 Olga MillersBrian Crenshaw MD Electronically signed by Olga MillersBrian Crenshaw MD  Signature Date/Time: 05/29/2020/11:49:41 AM    Final    VAS US CAROTID  Result Date: 05/29/2020 Carotid Arterial Duplex Study  Indications:       BRAO on the right. Risk Factors:      Hyperlipidemia. Comparison Study:  no prior Performing Technologist: Blanch Media RVS  Examination Guidelines: A complete evaluation includes B-mode imaging, spectral Doppler, color Doppler, and power Doppler as needed of all accessible portions of each vessel. Bilateral testing is considered an integral part of a complete examination. Limited examinations for reoccurring indications may be performed as noted.  Right Carotid Findings: +----------+--------+--------+--------+------------------+---------+           PSV cm/sEDV cm/sStenosisPlaque DescriptionComments  +----------+--------+--------+--------+------------------+---------+ CCA Prox  70      11              heterogenous                +----------+--------+--------+--------+------------------+---------+ CCA Distal63      17              heterogenous      Shadowing +----------+--------+--------+--------+------------------+---------+ ICA Prox  126     Fred      1-39%   heterogenous                +----------+--------+--------+--------+------------------+---------+ ICA Distal97      32                                          +----------+--------+--------+--------+------------------+---------+ ECA       138     14                                          +----------+--------+--------+--------+------------------+---------+ +----------+--------+-------+--------+-------------------+           PSV cm/sEDV cmsDescribeArm Pressure (mmHG) +----------+--------+-------+--------+-------------------+ [id number]                                         +----------+--------+-------+--------+-------------------+ +---------+--------+--+--------+--+---------+ VertebralPSV cm/s65EDV cm/s15Antegrade  +---------+--------+--+--------+--+---------+   Summary: Right Carotid: Velocities in the right ICA are consistent with a 1-39% stenosis. Vertebrals: Right vertebral artery demonstrates antegrade flow. *See table(s) above for measurements and observations.    Preliminary         The results of significant diagnostics from this hospitalization (including imaging, microbiology, ancillary and laboratory) are listed below for reference.     Microbiology: Recent Results (from the past 240 hour(s))  SARS CORONAVIRUS 2 (TAT 6-24 HRS) Nasopharyngeal Nasopharyngeal Swab     Status: None   Collection Time: 05/28/20  6:39 PM   Specimen: Nasopharyngeal Swab  Result Value Ref Range Status   SARS Coronavirus 2 NEGATIVE NEGATIVE Final    Comment: (NOTE) SARS-CoV-2 target nucleic acids are NOT DETECTED.  The SARS-CoV-2 RNA is generally detectable in upper and lower respiratory specimens during the acute phase of infection. Negative results do not preclude SARS-CoV-2 infection, do not rule out co-infections with other pathogens, and should not be used as the sole basis for treatment or other patient management decisions. Negative results must be combined with clinical observations, patient history, and epidemiological information. The expected result is Negative.  Fact Sheet for Patients: HairSlick.no  Fact Sheet for Healthcare Providers: quierodirigir.com  This test is not yet approved or cleared by the [place] FDA and  has been authorized for detection and/or  diagnosis of SARS-CoV-2 by FDA under an Emergency Use Authorization (EUA). This EUA will remain  in effect (meaning this test can be used) for the duration of the COVID-19 declaration under Se ction 564(b)(1) of the Act, 21 U.S.C. section 360bbb-3(b)(1), unless the authorization is terminated or revoked sooner.  Performed at [facility], [address],  [place], [place] [address]      Labs:  CBC: Recent Labs  Lab 05/28/20 1256 05/28/20 1317  WBC  --  6.6  NEUTROABS  --  3.4  HGB 15.3 14.9  HCT 45.0 47.0  MCV  --  96.5  PLT  --  239   BMP &GFR Recent Labs  Lab 05/28/20 1256 05/28/20 1317  NA 141 139  K 4.2 4.5  CL 106 105  CO2  --  22  GLUCOSE 97 99  BUN 14 13  CREATININE 1.00 1.09  CALCIUM  --  9.2   CrCl cannot be calculated (Unknown ideal weight.). Liver & Pancreas: Recent Labs  Lab 05/28/20 1317  AST 21  ALT 27  ALKPHOS 52  BILITOT 0.7  PROT 6.9  ALBUMIN 4.1   No results for input(s): LIPASE, AMYLASE in the last 168 hours. No results for input(s): AMMONIA in the last 168 hours. Diabetic: Recent Labs    05/29/20 0753  HGBA1C 5.9*   No results for input(s): GLUCAP in the last 168 hours. Cardiac Enzymes: Recent Labs  Lab 05/28/20 2228  CKTOTAL 188   No results for input(s): PROBNP in the last 8760 hours. Coagulation Profile: Recent Labs  Lab 05/28/20 1317  INR 1.0   Thyroid Function Tests: No results for input(s): TSH, T4TOTAL, FREET4, T3FREE, THYROIDAB in the last 72 hours. Lipid Profile: Recent Labs    05/29/20 0753  CHOL 154  HDL 36*  LDLCALC 71  TRIG 604*  CHOLHDL 4.3   Anemia Panel: No results for input(s): VITAMINB12, FOLATE, FERRITIN, TIBC, IRON, RETICCTPCT in the last 72 hours. Urine analysis: No results found for: COLORURINE, APPEARANCEUR, LABSPEC, PHURINE, GLUCOSEU, HGBUR, BILIRUBINUR, KETONESUR, PROTEINUR, UROBILINOGEN, NITRITE, LEUKOCYTESUR Sepsis Labs: Invalid input(s): PROCALCITONIN, LACTICIDVEN   Time coordinating discharge: 35 minutes  SIGNED:  Almon Hercules, MD  [facility] 05/29/2020, 2:31 PM  If 7PM-7AM, please contact night-coverage [email or web address]

## 2020-05-29 NOTE — Progress Notes (Signed)
STROKE TEAM PROGRESS NOTE   INTERVAL HISTORY Wife at the bedside. Pt still has right eye central scotoma. He denies any other symptoms of concern. Specifically, no new numbness/tingling, weakness, difficulty speaking or worsening of vision. He has not slept all night and is anxious regarding his diagnosis. So far MRI negative and CTA neck no high grade stenosis, but he did have right ICA calcified plaque. However, CUS unremarkable and VVS Dr. Edilia Bo will follow up with him as outpt. Pt denies any heart palpitation, will recommend 30 day cardiac event monitoring.   Vitals:   05/29/20 0715 05/29/20 0730 05/29/20 0900 05/29/20 1008  BP: (!) 148/80 (!) 134/102 127/68 (!) 146/73  Pulse: 61 (!) 56 62 61  Resp: 15 16 11 17   Temp:      SpO2: 98% 94% 93% 93%   CBC:  Recent Labs  Lab 05/28/20 1256 05/28/20 1317  WBC  --  6.6  NEUTROABS  --  3.4  HGB 15.3 14.9  HCT 45.0 47.0  MCV  --  96.5  PLT  --  239   Basic Metabolic Panel:  Recent Labs  Lab 05/28/20 1256 05/28/20 1317  NA 141 139  K 4.2 4.5  CL 106 105  CO2  --  22  GLUCOSE 97 99  BUN 14 13  CREATININE 1.00 1.09  CALCIUM  --  9.2   Lipid Panel:  Recent Labs  Lab 05/29/20 0753  CHOL 154  TRIG 235*  HDL 36*  CHOLHDL 4.3  VLDL 47*  LDLCALC 71   HgbA1c:  Recent Labs  Lab 05/29/20 0753  HGBA1C 5.9*   Urine Drug Screen: No results for input(s): LABOPIA, COCAINSCRNUR, LABBENZ, AMPHETMU, THCU, LABBARB in the last 168 hours.  Alcohol Level No results for input(s): ETH in the last 168 hours.  IMAGING past 24 hours CT ANGIO HEAD W OR WO CONTRAST  Result Date: 05/29/2020 CLINICAL DATA:  Right eye vision loss EXAM: CT ANGIOGRAPHY HEAD AND NECK TECHNIQUE: Multidetector CT imaging of the head and neck was performed using the standard protocol during bolus administration of intravenous contrast. Multiplanar CT image reconstructions and MIPs were obtained to evaluate the vascular anatomy. Carotid stenosis measurements (when  applicable) are obtained utilizing NASCET criteria, using the distal internal carotid diameter as the denominator. CONTRAST:  44mL OMNIPAQUE IOHEXOL 350 MG/ML SOLN COMPARISON:  None. FINDINGS: CTA NECK FINDINGS SKELETON: There is no bony spinal canal stenosis. No lytic or blastic lesion. OTHER NECK: Normal pharynx, larynx and major salivary glands. No cervical lymphadenopathy. Unremarkable thyroid gland. UPPER CHEST: No pneumothorax or pleural effusion. No nodules or masses. AORTIC ARCH: There is calcific atherosclerosis of the aortic arch. There is no aneurysm, dissection or hemodynamically significant stenosis of the visualized portion of the aorta. Conventional 3 vessel aortic branching pattern. The visualized proximal subclavian arteries are widely patent. RIGHT CAROTID SYSTEM: No dissection, occlusion or aneurysm. There is mixed density atherosclerosis extending into the proximal ICA, resulting in less than 50% stenosis. LEFT CAROTID SYSTEM: No dissection, occlusion or aneurysm. Mild atherosclerotic calcification at the carotid bifurcation without hemodynamically significant stenosis. VERTEBRAL ARTERIES: Right dominant configuration. Both origins are clearly patent. There is no dissection, occlusion or flow-limiting stenosis to the skull base (V1-V3 segments). CTA HEAD FINDINGS POSTERIOR CIRCULATION: --Vertebral arteries: Normal V4 segments. --Inferior cerebellar arteries: Normal. --Basilar artery: Normal. --Superior cerebellar arteries: Normal. --Posterior cerebral arteries (PCA): Normal. ANTERIOR CIRCULATION: --Intracranial internal carotid arteries: Atherosclerotic calcification of the internal carotid arteries at the skull base without hemodynamically significant  stenosis. --Anterior cerebral arteries (ACA): Normal. Both A1 segments are present. Patent anterior communicating artery (a-comm). --Middle cerebral arteries (MCA): Normal. VENOUS SINUSES: As permitted by contrast timing, patent. ANATOMIC VARIANTS:  Fetal origin of the right posterior cerebral artery. Review of the MIP images confirms the above findings. IMPRESSION: 1. No intracranial arterial occlusion or high-grade stenosis. 2. Bilateral carotid bifurcation atherosclerosis without hemodynamically significant stenosis by NASCET criteria. Aortic Atherosclerosis (ICD10-I70.0). Electronically Signed   By: Deatra Robinson M.D.   On: 05/29/2020 00:30   CT HEAD WO CONTRAST  Result Date: 05/28/2020 CLINICAL DATA:  Right-sided vision loss for several days EXAM: CT HEAD WITHOUT CONTRAST TECHNIQUE: Contiguous axial images were obtained from the base of the skull through the vertex without intravenous contrast. COMPARISON:  None. FINDINGS: Brain: No evidence of acute infarction, hemorrhage, hydrocephalus, extra-axial collection or mass lesion/mass effect. Vascular: No hyperdense vessel or unexpected calcification. Skull: Normal. Negative for fracture or focal lesion. Sinuses/Orbits: No acute finding. Other: None. IMPRESSION: No acute intracranial abnormality noted. Electronically Signed   By: Alcide Clever M.D.   On: 05/28/2020 13:58   CT ANGIO NECK W OR WO CONTRAST  Result Date: 05/29/2020 CLINICAL DATA:  Right eye vision loss EXAM: CT ANGIOGRAPHY HEAD AND NECK TECHNIQUE: Multidetector CT imaging of the head and neck was performed using the standard protocol during bolus administration of intravenous contrast. Multiplanar CT image reconstructions and MIPs were obtained to evaluate the vascular anatomy. Carotid stenosis measurements (when applicable) are obtained utilizing NASCET criteria, using the distal internal carotid diameter as the denominator. CONTRAST:  35mL OMNIPAQUE IOHEXOL 350 MG/ML SOLN COMPARISON:  None. FINDINGS: CTA NECK FINDINGS SKELETON: There is no bony spinal canal stenosis. No lytic or blastic lesion. OTHER NECK: Normal pharynx, larynx and major salivary glands. No cervical lymphadenopathy. Unremarkable thyroid gland. UPPER CHEST: No pneumothorax  or pleural effusion. No nodules or masses. AORTIC ARCH: There is calcific atherosclerosis of the aortic arch. There is no aneurysm, dissection or hemodynamically significant stenosis of the visualized portion of the aorta. Conventional 3 vessel aortic branching pattern. The visualized proximal subclavian arteries are widely patent. RIGHT CAROTID SYSTEM: No dissection, occlusion or aneurysm. There is mixed density atherosclerosis extending into the proximal ICA, resulting in less than 50% stenosis. LEFT CAROTID SYSTEM: No dissection, occlusion or aneurysm. Mild atherosclerotic calcification at the carotid bifurcation without hemodynamically significant stenosis. VERTEBRAL ARTERIES: Right dominant configuration. Both origins are clearly patent. There is no dissection, occlusion or flow-limiting stenosis to the skull base (V1-V3 segments). CTA HEAD FINDINGS POSTERIOR CIRCULATION: --Vertebral arteries: Normal V4 segments. --Inferior cerebellar arteries: Normal. --Basilar artery: Normal. --Superior cerebellar arteries: Normal. --Posterior cerebral arteries (PCA): Normal. ANTERIOR CIRCULATION: --Intracranial internal carotid arteries: Atherosclerotic calcification of the internal carotid arteries at the skull base without hemodynamically significant stenosis. --Anterior cerebral arteries (ACA): Normal. Both A1 segments are present. Patent anterior communicating artery (a-comm). --Middle cerebral arteries (MCA): Normal. VENOUS SINUSES: As permitted by contrast timing, patent. ANATOMIC VARIANTS: Fetal origin of the right posterior cerebral artery. Review of the MIP images confirms the above findings. IMPRESSION: 1. No intracranial arterial occlusion or high-grade stenosis. 2. Bilateral carotid bifurcation atherosclerosis without hemodynamically significant stenosis by NASCET criteria. Aortic Atherosclerosis (ICD10-I70.0). Electronically Signed   By: Deatra Robinson M.D.   On: 05/29/2020 00:30   MR BRAIN WO  CONTRAST  Result Date: 05/28/2020 CLINICAL DATA:  Acute neurologic deficit EXAM: MRI HEAD WITHOUT CONTRAST TECHNIQUE: Multiplanar, multiecho pulse sequences of the brain and surrounding structures were obtained without intravenous contrast. COMPARISON:  None. FINDINGS: Brain: No acute infarct, mass effect or extra-axial collection. Single focus of chronic microhemorrhage near the posterior right lentiform nucleus. There is multifocal hyperintense T2-weighted signal within the white matter. Parenchymal volume and CSF spaces are normal. the midline structures are normal. Vascular: Major flow voids are preserved. Skull and upper cervical spine: Normal calvarium and skull base. Visualized upper cervical spine and soft tissues are normal. Sinuses/Orbits:No paranasal sinus fluid levels or advanced mucosal thickening. No mastoid or middle ear effusion. Normal orbits. IMPRESSION: 1. No acute intracranial abnormality. 2. White matter changes that may be associated with migraines or chronic small vessel ischemia. Electronically Signed   By: Deatra Robinson M.D.   On: 05/28/2020 22:42   VAS US CAROTID  Result Date: 05/29/2020 Carotid Arterial Duplex Study Indications:       BRAO on the right. Risk Factors:      Hyperlipidemia. Comparison Study:  no prior Performing Technologist: Blanch Media RVS  Examination Guidelines: A complete evaluation includes B-mode imaging, spectral Doppler, color Doppler, and power Doppler as needed of all accessible portions of each vessel. Bilateral testing is considered an integral part of a complete examination. Limited examinations for reoccurring indications may be performed as noted.  Right Carotid Findings: +----------+--------+--------+--------+------------------+---------+           PSV cm/sEDV cm/sStenosisPlaque DescriptionComments  +----------+--------+--------+--------+------------------+---------+ CCA Prox  70      11              heterogenous                 +----------+--------+--------+--------+------------------+---------+ CCA Distal63      17              heterogenous      Shadowing +----------+--------+--------+--------+------------------+---------+ ICA Prox  126     34      1-39%   heterogenous                +----------+--------+--------+--------+------------------+---------+ ICA Distal97      32                                          +----------+--------+--------+--------+------------------+---------+ ECA       138     14                                          +----------+--------+--------+--------+------------------+---------+ +----------+--------+-------+--------+-------------------+           PSV cm/sEDV cmsDescribeArm Pressure (mmHG) +----------+--------+-------+--------+-------------------+ [id number]                                         +----------+--------+-------+--------+-------------------+ +---------+--------+--+--------+--+---------+ VertebralPSV cm/s65EDV cm/s15Antegrade +---------+--------+--+--------+--+---------+   Summary: Right Carotid: Velocities in the right ICA are consistent with a 1-39% stenosis. Vertebrals: Right vertebral artery demonstrates antegrade flow. *See table(s) above for measurements and observations.    Preliminary    PHYSICAL EXAM  Temp:  [99.2 F (37.3 C)] 99.2 F (37.3 C) (02/08 1444) Pulse Rate:  [50-70] 59 (02/08 1444) Resp:  [11-21] 18 (02/08 1444) BP: (116-172)/(49-132) 135/71 (02/08 1444) SpO2:  [89 %-100 %] 98 % (02/08 1444)  General - Well nourished, well developed, in no apparent distress.  Ophthalmologic - fundi not visualized due  to noncooperation.  Cardiovascular - Regular rhythm and rate.  Mental Status -  Level of arousal and orientation to time, place, and person were intact. Language including expression, naming, repetition, comprehension was assessed and found intact. Attention span and concentration were normal. Fund of Knowledge  was assessed and was intact.  Cranial Nerves II - XII - II - Visual field intact OU. However, right eye central scotoma III, IV, VI - Extraocular movements intact. V - Facial sensation intact bilaterally. VII - Facial movement intact bilaterally. VIII - Hearing & vestibular intact bilaterally. X - Palate elevates symmetrically. XI - Chin turning & shoulder shrug intact bilaterally. XII - Tongue protrusion intact.  Motor Strength - The patient's strength was normal in all extremities and pronator drift was absent.  Bulk was normal and fasciculations were absent.   Motor Tone - Muscle tone was assessed at the neck and appendages and was normal.  Reflexes - The patient's reflexes were symmetrical in all extremities and he had no pathological reflexes.  Sensory - Light touch, temperature/pinprick were assessed and were symmetrical.    Coordination - The patient had normal movements in the hands and feet with no ataxia or dysmetria.  Tremor was absent.  Gait and Station - deferred.   ASSESSMENT/PLAN Mr. ELISON WORREL is a 65 y.o. male with history of  presenting with   BRAO with differential for etiology that includes retinal artery atherosclerosis, carotid atherosclerosis, and occult atrial fibrillation.   Right BRAO  No acute abnormality on CT head code stroke  CTA head & neck: Bilateral carotid bifurcation atherosclerosis without hemodynamically significant stenosis by NASCET criteria.  MR Brain No acute intracranial abnormality.   Carotid Doppler unremarkable  2D Echo EF 55-60%  Recommend 30 day cardiac event monitoring as outpt to rule out afib  LDL 71  HgbA1c 5.9  VTE prophylaxis - SCDs  On ASA 81mg  daily prior to admission, now on ASA 81 and plavix 75 DAPT for 3 weeks and then ASA alone.  Therapy recommendations:  none  Disposition:  home   Need to continue follow up with ophthalmology.   Right carotid athero  CTA neck showed right ICA calcified plaque  but no stenosis  CUS showed heterogenous plaque but no stenosis  VVS consulted but no intervention needed at this time per Dr.  Pt will follow up with Dr. Edilia Bo as outpt  HTN  . No BP med PTA . BP on the high end but fluctuate . PCP follow up . Long-term BP goal normotensive  Hyperlipidemia  Home meds:  crestor 20mg  daily  LDL 71, goal < 70  On lipitor 40 mg now  Continue statin at discharge   Other Stroke Risk Factors  Advance age   Other Active Problems    Hospital day # 0  Neurology will sign off. Please call with questions. Pt will follow up with stroke clinic NP at [facility] in about 4 weeks. Thanks for the consult.  , MD PhD Stroke Neurology 05/29/2020 4:25 PM   To contact Stroke Continuity provider, please refer to [facility] Plan. After hours, contact General Neurology

## 2020-05-29 NOTE — Consult Note (Addendum)
Hospital Consult    Reason for Consult:  Right retinal artery occlusion Requesting Physician:  ER MRN #:  [id number]  History of Present Illness: This is a 65 y.o. male who presents with right eye visual loss.  Pt states that on Friday night, he noticed that he was having some visual changes but given it was Friday night, he would follow up with doctor on Monday.  He went hunting on Saturday and that is when he noticed that he could not see through the scope.  He states the bottom part of the visual field is gone.  He states when he is looking me in the eye, he cannot see my mask.  He has not had any other stroke sx such as speech problems, unilateral weakness or paralysis or facial droop.  He states that on Saturday night, he had an episode of right leg sciatica but this was gone the next morning.  He denies any hx of afib.  He states he has not noticed any palpitations or fast heart rate.  He denies any claudication sx or non healing wounds.  He has never had any surgery.  He states he has some knee issues.  He does not have any family hx of AAA.   The pt is on a statin for cholesterol management.  The pt is on a daily aspirin.   Other AC:  none The pt is not on meds for hypertension.   The pt is not diabetic.   Tobacco hx:  Former-quit in 2010  Past Medical History:  Diagnosis Date  . Anxiety   . Hyperlipidemia     History reviewed. No pertinent surgical history.  Allergies  Allergen Reactions  . Amoxicillin Other (See Comments)    Prior to Admission medications   Medication Sig Start Date End Date Taking? Authorizing Provider  acetaminophen (TYLENOL) 500 MG tablet Take 500 mg by mouth every 6 (six) hours as needed for mild pain or headache.   Yes [provider]  aspirin 81 MG EC tablet Take 81 mg by mouth daily.   Yes [provider]  buPROPion (WELLBUTRIN XL) 300 MG 24 hr tablet Take 300 mg by mouth daily. 03/26/20  Yes [provider]  montelukast  (SINGULAIR) 10 MG tablet Take 10 mg by mouth daily. 10/07/16  Yes [provider]  clopidogrel (PLAVIX) 75 MG tablet Take 1 tablet (75 mg total) by mouth daily. 05/30/20   Almon Hercules, MD  rosuvastatin (CRESTOR) 20 MG tablet Take 1 tablet (20 mg total) by mouth daily. 05/29/20   Almon Hercules, MD    Social History   Socioeconomic History  . Marital status: Married    Spouse name: Not on file  . Number of children: Not on file  . Years of education: Not on file  . Highest education level: Not on file  Occupational History  . Not on file  Tobacco Use  . Smoking status: Former Games developer  . Smokeless tobacco: Never Used  . Tobacco comment: Quit smoking cigarettes in 2010  Substance and Sexual Activity  . Alcohol use: Not Currently  . Drug use: Never  . Sexual activity: Not on file  Other Topics Concern  . Not on file  Social History Narrative  . Not on file   Social Determinants of Health   Financial Resource Strain: Not on file  Food Insecurity: Not on file  Transportation Needs: Not on file  Physical Activity: Not on file  Stress: Not on  file  Social Connections: Not on file  Intimate Partner Violence: Not on file     Family History  Problem Relation Age of Onset  . Hypertension Mother   . Diabetes Mother     ROS: [x]  Positive   [ ]  Negative   [ ]  All sytems reviewed and are negative  Cardiac: []  chest pain/pressure []  palpitations []  SOB lying flat []  DOE  Vascular: []  pain in legs while walking []  pain in legs at rest []  pain in legs at night []  non-healing ulcers []  hx of DVT []  swelling in legs  Pulmonary: []  asthma/wheezing []  home O2  Neurologic: []  hx of CVA []  mini stroke [x]  vision loss   Hematologic: []  hx of cancer  Endocrine:   []  diabetes []  thyroid disease  GI []  GERD  GU: []  CKD/renal failure []  HD--[]  M/W/F or []  T/T/S  Psychiatric: []  anxiety []  depression  Musculoskeletal: []  arthritis []  joint  pain  Integumentary: []  rashes []  ulcers  Constitutional: []  fever  []  chills  Physical Examination  Vitals:   05/29/20 0900 05/29/20 1008  BP: 127/68 (!) 146/73  Pulse: 62 61  Resp: 11 17  Temp:    SpO2: 93% 93%   There is no height or weight on file to calculate BMI.  General:  WDWN in NAD Gait: Not observed HENT: WNL, normocephalic Pulmonary: normal non-labored breathing Cardiac: regular, without Murmur; without carotid bruits Abdomen:  soft, NT/ND; aortic pulse is not palpable Skin: without rashes Vascular Exam/Pulses:  Right Left  Radial 2+ (normal) 2+ (normal)  Popliteal Unable to palpate Unable to palpate  DP 2+ (normal) 2+ (normal)  PT 2+ (normal) 2+ (normal)   Extremities: without ischemic changes, without Gangrene , without cellulitis; without open wounds Musculoskeletal: no muscle wasting or atrophy  Neurologic: A&O X 3; speech is fluent/normal Psychiatric:  The pt has Normal affect.   CBC    Component Value Date/Time   WBC 6.6 05/28/2020 1317   RBC 4.87 05/28/2020 1317   HGB 14.9 05/28/2020 1317   HCT 47.0 05/28/2020 1317   PLT 239 05/28/2020 1317   MCV 96.5 05/28/2020 1317   MCH 30.6 05/28/2020 1317   MCHC 31.7 05/28/2020 1317   RDW 12.4 05/28/2020 1317   LYMPHSABS 2.4 05/28/2020 1317   MONOABS 0.6 05/28/2020 1317   EOSABS 0.2 05/28/2020 1317   BASOSABS 0.0 05/28/2020 1317    BMET    Component Value Date/Time   NA 139 05/28/2020 1317   K 4.5 05/28/2020 1317   CL 105 05/28/2020 1317   CO2 22 05/28/2020 1317   GLUCOSE 99 05/28/2020 1317   BUN 13 05/28/2020 1317   CREATININE 1.09 05/28/2020 1317   CALCIUM 9.2 05/28/2020 1317   GFRNONAA >60 05/28/2020 1317    COAGS: Lab Results  Component Value Date   INR 1.0 05/28/2020     Non-Invasive Vascular Imaging:   Carotid duplex 05/29/2020 Right Carotid: Velocities in the right ICA are consistent with a 1-39%  Stenosis.  CTA head neck 05/29/2020 1. No intracranial arterial occlusion  or high-grade stenosis. 2. Bilateral carotid bifurcation atherosclerosis without hemodynamically significant stenosis by NASCET criteria.    ASSESSMENT/PLAN: This is a 65 y.o. male with right eye hemianopsia found to have a retinal artery occlusion.  He has not had any other stroke sx.   -pt with some calcification on CTA, but not hemodynamically significant.  He did have a carotid duplex, which revealed 1-39% stenosis on the right.   -  most likely this is not the cause of his hemianopsia.   -Dr. Edilia Bo has reviewed the CTA and we will have pt follow up in 3 months with carotid duplex and see Dr. Edilia Bo in the office.  -continue statin/asa from vascular standpoint.    Doreatha Massed, PA-C [facility] [phone]  I have interviewed the patient and examined the patient. I agree with the findings by the PA.  I reviewed the images of the CT scan although there is some calcific plaque at the right carotid bifurcation there is no significant stenosis.  Duplex confirms no significantly elevated velocities on the right.  I plan on seeing him back in 3 months with a follow-up duplex.  He is on aspirin and is on a statin.  Currently I do not see an indication for carotid endarterectomy.  Cari Caraway, MD [phone]

## 2020-05-29 NOTE — ED Notes (Addendum)
Pt has discharge order pending neuro and vascular consults.

## 2020-06-02 ENCOUNTER — Ambulatory Visit (INDEPENDENT_AMBULATORY_CARE_PROVIDER_SITE_OTHER): Payer: PPO

## 2020-06-02 DIAGNOSIS — H34231 Retinal artery branch occlusion, right eye: Secondary | ICD-10-CM

## 2020-06-02 DIAGNOSIS — I4891 Unspecified atrial fibrillation: Secondary | ICD-10-CM | POA: Diagnosis not present

## 2020-06-05 DIAGNOSIS — E7800 Pure hypercholesterolemia, unspecified: Secondary | ICD-10-CM | POA: Diagnosis not present

## 2020-06-05 DIAGNOSIS — I10 Essential (primary) hypertension: Secondary | ICD-10-CM | POA: Diagnosis not present

## 2020-06-05 DIAGNOSIS — H34231 Retinal artery branch occlusion, right eye: Secondary | ICD-10-CM | POA: Diagnosis not present

## 2020-06-05 DIAGNOSIS — Z09 Encounter for follow-up examination after completed treatment for conditions other than malignant neoplasm: Secondary | ICD-10-CM | POA: Diagnosis not present

## 2020-06-12 DIAGNOSIS — H34231 Retinal artery branch occlusion, right eye: Secondary | ICD-10-CM | POA: Diagnosis not present

## 2020-06-12 DIAGNOSIS — H35033 Hypertensive retinopathy, bilateral: Secondary | ICD-10-CM | POA: Diagnosis not present

## 2020-06-12 DIAGNOSIS — H43811 Vitreous degeneration, right eye: Secondary | ICD-10-CM | POA: Diagnosis not present

## 2020-06-19 ENCOUNTER — Other Ambulatory Visit: Payer: Self-pay | Admitting: Physician Assistant

## 2020-06-19 DIAGNOSIS — H34231 Retinal artery branch occlusion, right eye: Secondary | ICD-10-CM

## 2020-06-19 DIAGNOSIS — I4891 Unspecified atrial fibrillation: Secondary | ICD-10-CM

## 2020-06-27 ENCOUNTER — Ambulatory Visit: Payer: PPO | Admitting: Adult Health

## 2020-06-27 ENCOUNTER — Encounter: Payer: Self-pay | Admitting: Adult Health

## 2020-06-27 VITALS — BP 104/78 | HR 66 | Ht 66.0 in | Wt 181.0 lb

## 2020-06-27 DIAGNOSIS — H34231 Retinal artery branch occlusion, right eye: Secondary | ICD-10-CM | POA: Diagnosis not present

## 2020-06-27 DIAGNOSIS — H53411 Scotoma involving central area, right eye: Secondary | ICD-10-CM

## 2020-06-27 NOTE — Progress Notes (Signed)
[facility] [address] [place]. [place] [address] [phone]       HOSPITAL FOLLOW UP NOTE  Fred Hammond Date of Birth:  [birth year] Medical Record Number:  [id number]   Reason for Referral:  hospital stroke follow up    SUBJECTIVE:   CHIEF COMPLAINT:  Chief Complaint  Patient presents with  . Follow-up    RM 14 alone PT is well, just lost some vision in R eye but no complaints     HPI:   Fred Hammond is a 65 y.o. male with PMHx of HLD who presented to [facility] ED on 05/28/2020 presenting with BRAO with differential for etiology that includes retinal artery atherosclerosis, carotid atherosclerosis, and occult atrial fibrillation.   Personally reviewed hospitalization pertinent progress notes, lab work and imaging with summary provided.  Evaluated by Dr. Roda Shutters.  Recommended 30-day cardiac event monitor outpatient to rule out A. fib.  Right ICA calcified plaque but no stenosis -evaluated by Dr. Durwin Nora that indication for intervention at that time and advised follow-up with me outpatient.  LDL 71 -increase Crestor dosage from 10 mg to 20 mg daily.  Other stroke risk factors include advanced age.  No prior stroke history.  Right BRAO  No acute abnormality on CT head code stroke  CTA head & neck: Bilateral carotid bifurcation atherosclerosis without hemodynamically significant stenosis by NASCET criteria.  MR Brain No acute intracranial abnormality.   Carotid Doppler unremarkable  2D Echo EF 55-60%  Recommend 30 day cardiac event monitoring as outpt to rule out afib  LDL 71  HgbA1c 5.9  VTE prophylaxis - SCDs  On ASA 81mg  daily prior to admission, now on ASA 81 and plavix 75 DAPT for 3 weeks and then ASA alone.  Therapy recommendations:  none  Disposition:  home    Today, 06/27/2020, Fred Hammond is being seen for hospital follow-up unaccompanied  Reports residual right eye lower central visual loss - reports only difficulty he has  experienced is looking through a scope on is gun as he is right-eye dominant but otherwise no other difficulty He has had evaluation by ophthalmology without further recommendations and plans to follow-up as needed Denies new stroke/TIA symptoms   Completed 3 weeks DAPT and remains on aspirin alone without bleeding or bruising Compliant on Crestor 20 mg daily -denies side effects Blood pressure today 104/78 -does not routinely monitor at home is typically stable  Cardiac monitor completed which did not show evidence of atrial fibrillation Scheduled f/u with [facility] Dr. Janice Hammond on 08/29/2020  No further concerns at this time       ROS:   14 system review of systems performed and negative with exception of visual loss  PMH:  Past Medical History:  Diagnosis Date  . Anxiety   . Hyperlipidemia     PSH: History reviewed. No pertinent surgical history.  Social History:  Social History   Socioeconomic History  . Marital status: Married    Spouse name: Not on file  . Number of children: Not on file  . Years of education: Not on file  . Highest education level: Not on file  Occupational History  . Not on file  Tobacco Use  . Smoking status: Former 10/29/2020  . Smokeless tobacco: Never Used  . Tobacco comment: Quit smoking cigarettes in 2010  Substance and Sexual Activity  . Alcohol use: Not Currently  . Drug use: Never  . Sexual activity: Not on file  Other Topics Concern  .  Not on file  Social History Narrative  . Not on file   Social Determinants of Health   Financial Resource Strain: Not on file  Food Insecurity: Not on file  Transportation Needs: Not on file  Physical Activity: Not on file  Stress: Not on file  Social Connections: Not on file  Intimate Partner Violence: Not on file    Family History:  Family History  Problem Relation Age of Onset  . Hypertension Mother   . Diabetes Mother     Medications:   Current Outpatient Medications on File Prior to  Visit  Medication Sig Dispense Refill  . acetaminophen (TYLENOL) 500 MG tablet Take 500 mg by mouth every 6 (six) hours as needed for mild pain or headache.    Marland Kitchen aspirin 81 MG EC tablet Take 81 mg by mouth daily.    Marland Kitchen buPROPion (WELLBUTRIN XL) 300 MG 24 hr tablet Take 300 mg by mouth daily.    . montelukast (SINGULAIR) 10 MG tablet Take 10 mg by mouth daily.    . rosuvastatin (CRESTOR) 20 MG tablet Take 1 tablet (20 mg total) by mouth daily. 90 tablet 1   No current facility-administered medications on file prior to visit.    Allergies:   Allergies  Allergen Reactions  . Amoxicillin Other (See Comments)      OBJECTIVE:  Physical Exam  Vitals:   06/27/20 0957  BP: 104/78  Pulse: 66  Weight: 181 lb (82.1 kg)  Height: 5\' 6"  (1.676 m)   Body mass index is 29.21 kg/m. No exam data present  General: well developed, well nourished,  very pleasant middle-age Caucasian male, seated, in no evident distress Head: head normocephalic and atraumatic.   Neck: supple with no carotid or supraclavicular bruits Cardiovascular: regular rate and rhythm, no murmurs Musculoskeletal: no deformity Skin:  no rash/petichiae Vascular:  Normal pulses all extremities   Neurologic Exam Mental Status: Awake and fully alert.   Fluent speech and language.  Oriented to place and time. Recent and remote memory intact. Attention span, concentration and fund of knowledge appropriate. Mood and affect appropriate.  Cranial Nerves: Fundoscopic exam reveals sharp disc margins. Pupils equal, briskly reactive to light. Extraocular movements full without nystagmus. Visual fields OS full to confrontation, OD intact except lower central visual loss. Hearing intact. Facial sensation intact. Face, tongue, palate moves normally and symmetrically.  Motor: Normal bulk and tone. Normal strength in all tested extremity muscles Sensory.: intact to touch , pinprick , position and vibratory sensation.  Coordination: Rapid  alternating movements normal in all extremities. Finger-to-nose and heel-to-shin performed accurately bilaterally. Gait and Station: Arises from chair without difficulty. Stance is normal. Gait demonstrates normal stride length and balance with without use of assistive device. Tandem walk and heel toe without difficulty.  Reflexes: 1+ and symmetric. Toes downgoing.     NIHSS  1 Modified Rankin  1      ASSESSMENT: Fred Hammond is a 65 y.o. year old male presented with right eye visual field loss with evidence of BRAO with ddx radiology including retinal artery atherosclerosis vs carotid atherosclerosis vs occult A. fib on 05/28/2020. Vascular risk factors include HLD and right carotid atherosclerosis.      PLAN:  1. BRAO :  a. Residual deficit: OD lower central scotoma. F/u w/ ophthalmology as advised b. Cardiac monitoring negative for atrial fibrillation c. Continue aspirin 81 mg daily  and atorvastatin for secondary stroke prevention.   d. Discussed secondary stroke prevention measures and importance  of close PCP follow up for aggressive stroke risk factor management  2. HLD: LDL goal <70. Recent LDL 71 -increased Crestor from 10 to 20 mg daily during recent stroke admission.  PCP plans on rechecking lipid panel around 08/2020 3. Right carotid atherosclerosis:  a. CTA R ICA calcified plaque but no stenosis  b. CUS heterogeneous plaque with no stenosis  c. followed by Dr. Edilia Bo - scheduled visit 5/11    Follow up in 6 months or call earlier if needed   CC:  [facility] provider: Dr. Pearlean Brownie PCP: Shellia Cleverly, PA    I spent 45 minutes of face-to-face and non-face-to-face time with patient.  This included previsit chart review including recent hospitalization pertinent progress notes, lab work and imaging, lab review, study review, order entry, electronic health record documentation, patient education regarding recent stroke and potential etiology, residual deficits, importance of  managing stroke risk factors and answered all other questions to patient satisfaction  Ihor Austin, AGNP-BC  [facility] [address] [place], [place] [address]  Phone [phone] Fax [phone] Note: This document was prepared with digital dictation and possible smart phrase technology. Any transcriptional errors that result from this process are unintentional.

## 2020-06-27 NOTE — Patient Instructions (Signed)
F/u with Dr. Edilia Bo as scheduled on 08/29/2020   Continue aspirin 81 mg daily  and Crestor 20 mg daily for secondary stroke prevention  Continue to follow up with PCP regarding cholesterol and blood pressure management  Maintain strict control of hypertension with blood pressure goal below 130/90, diabetes with hemoglobin A1c goal below 6.5% and cholesterol with LDL cholesterol (bad cholesterol) goal below 70 mg/dL.       Followup in the future with me in 6 months or call earlier if needed       Thank you for coming to see [place] at [facility]. I hope we have been able to provide you high quality care today.  You may receive a patient satisfaction survey over the next few weeks. We would appreciate your feedback and comments so that we may continue to improve ourselves and the health of our patients.

## 2020-07-08 NOTE — Progress Notes (Signed)
I agree with the above plan 

## 2020-08-20 ENCOUNTER — Other Ambulatory Visit: Payer: Self-pay

## 2020-08-20 DIAGNOSIS — H34231 Retinal artery branch occlusion, right eye: Secondary | ICD-10-CM

## 2020-08-29 ENCOUNTER — Ambulatory Visit: Payer: PPO | Admitting: Vascular Surgery

## 2020-08-29 ENCOUNTER — Encounter: Payer: Self-pay | Admitting: Vascular Surgery

## 2020-08-29 ENCOUNTER — Ambulatory Visit (HOSPITAL_COMMUNITY)
Admission: RE | Admit: 2020-08-29 | Discharge: 2020-08-29 | Disposition: A | Discharge status: Home / Self Care | Payer: PPO | Source: Ambulatory Visit | Attending: Vascular Surgery | Admitting: Vascular Surgery

## 2020-08-29 ENCOUNTER — Other Ambulatory Visit: Payer: Self-pay

## 2020-08-29 VITALS — BP 182/84 | HR 65 | Temp 98.4°F | Resp 20 | Ht 66.0 in | Wt 178.0 lb

## 2020-08-29 DIAGNOSIS — I6521 Occlusion and stenosis of right carotid artery: Secondary | ICD-10-CM

## 2020-08-29 DIAGNOSIS — H34231 Retinal artery branch occlusion, right eye: Secondary | ICD-10-CM | POA: Insufficient documentation

## 2020-08-29 NOTE — Progress Notes (Signed)
REASON FOR VISIT:   Follow-up of carotid disease  MEDICAL ISSUES:   MILD CAROTID DISEASE: The patient has a less than 39% stenosis bilaterally.  He has had no new neurologic symptoms.  I think we can stretch his follow-up out to 1 year and I will see him back at that time.  He is on aspirin and is on a statin.  LEFT SUBCLAVIAN STENOSIS: He does have a left subclavian stenosis but is asymptomatic.  Has had no vertebrobasilar symptoms.  I have instructed him to always have his blood pressure taken on the right as this will be more accurate.  We would only consider looking into this further if he developed significant vertebrobasilar symptoms.   HPI:   Fred Hammond is a pleasant 65 y.o. male who I saw in consultation in the emergency department on 05/29/2020.  He had presented with a right visual field cut.  He was hunting and noted that he could not see through the scope.  The bottom part of his visual field was gone.  He denied any other stroke symptoms.  CT of the neck showed bilateral carotid bifurcation atherosclerosis but no significant stenosis.  There was no significant intracranial disease.  I obtained a carotid duplex which showed a less than 39% right carotid stenosis.  At that point I did not see an indication for carotid endarterectomy and recommended follow-up duplex scan in 3 months.  Since I saw him in the hospital, he has had no new neurologic symptoms.  Specifically, he denies focal weakness or paresthesias, expressive or receptive aphasia,.  There is been no improvement in his visual field cut on the right.  He is on aspirin and is on a statin.  He has no problems with dizziness or unsteady gait.  Past Medical History:  Diagnosis Date  . Anxiety   . Hyperlipidemia     Family History  Problem Relation Age of Onset  . Hypertension Mother   . Diabetes Mother     SOCIAL HISTORY: Social History   Tobacco Use  . Smoking status: Former Games developer  . Smokeless  tobacco: Never Used  . Tobacco comment: Quit smoking cigarettes in 2010  Substance Use Topics  . Alcohol use: Not Currently    Allergies  Allergen Reactions  . Amoxicillin Other (See Comments)    Current Outpatient Medications  Medication Sig Dispense Refill  . acetaminophen (TYLENOL) 500 MG tablet Take 500 mg by mouth every 6 (six) hours as needed for mild pain or headache.    Marland Kitchen aspirin 81 MG EC tablet Take 81 mg by mouth daily.    Marland Kitchen buPROPion (WELLBUTRIN XL) 300 MG 24 hr tablet Take 300 mg by mouth daily.    . citalopram (CELEXA) 20 MG tablet Take 20 mg by mouth daily.    . montelukast (SINGULAIR) 10 MG tablet Take 10 mg by mouth daily.    . rosuvastatin (CRESTOR) 20 MG tablet Take 1 tablet (20 mg total) by mouth daily. 90 tablet 1   No current facility-administered medications for this visit.    REVIEW OF SYSTEMS:  [X]  denotes positive finding, [ ]  denotes negative finding Cardiac  Comments:  Chest pain or chest pressure:    Shortness of breath upon exertion:    Short of breath when lying flat:    Irregular heart rhythm:        Vascular    Pain in calf, thigh, or hip brought on by ambulation:    Pain in  feet at night that wakes you up from your sleep:     Blood clot in your veins:    Leg swelling:         Pulmonary    Oxygen at home:    Productive cough:     Wheezing:         Neurologic    Sudden weakness in arms or legs:     Sudden numbness in arms or legs:     Sudden onset of difficulty speaking or slurred speech:    Temporary loss of vision in one eye:  x   Problems with dizziness:         Gastrointestinal    Blood in stool:     Vomited blood:         Genitourinary    Burning when urinating:     Blood in urine:        Psychiatric    Major depression:         Hematologic    Bleeding problems:    Problems with blood clotting too easily:        Skin    Rashes or ulcers:        Constitutional    Fever or chills:     PHYSICAL EXAM:   Vitals:    08/29/20 1240 08/29/20 1243  BP: (!) 142/94 (!) 182/84  Pulse: 65   Resp: 20   Temp: 98.4 F (36.9 C)   SpO2: 97%   Weight: 178 lb (80.7 kg)   Height: 5\' 6"  (1.676 m)     GENERAL: The patient is a well-nourished male, in no acute distress. The vital signs are documented above. CARDIAC: There is a regular rate and rhythm.  VASCULAR: I do not detect carotid bruits. He has palpable posterior tibial pulses bilaterally. PULMONARY: There is good air exchange bilaterally without wheezing or rales. ABDOMEN: Soft and non-tender with normal pitched bowel sounds.  MUSCULOSKELETAL: There are no major deformities or cyanosis. NEUROLOGIC: No focal weakness or paresthesias are detected. SKIN: There are no ulcers or rashes noted. PSYCHIATRIC: The patient has a normal affect.  DATA:    CAROTID DUPLEX: I have independently interpreted his carotid duplex scan today.  On the right he has a less than 39% stenosis.  The right vertebral artery is patent with antegrade flow.  On the left, he has a less than 39% stenosis.  The left vertebral artery has retrograde flow.  There is evidence of a subclavian artery stenosis on the left with a peak systolic velocity of greater than 300.  Systolic pressure on the right is 160 mmHg.  Systolic blood pressure on the left 115 mmHg.  [facility] [phone]

## 2020-09-04 DIAGNOSIS — E7800 Pure hypercholesterolemia, unspecified: Secondary | ICD-10-CM | POA: Diagnosis not present

## 2020-09-04 DIAGNOSIS — L989 Disorder of the skin and subcutaneous tissue, unspecified: Secondary | ICD-10-CM | POA: Diagnosis not present

## 2020-09-04 DIAGNOSIS — I10 Essential (primary) hypertension: Secondary | ICD-10-CM | POA: Diagnosis not present

## 2020-09-04 DIAGNOSIS — M5416 Radiculopathy, lumbar region: Secondary | ICD-10-CM | POA: Diagnosis not present

## 2020-09-04 DIAGNOSIS — I6523 Occlusion and stenosis of bilateral carotid arteries: Secondary | ICD-10-CM | POA: Diagnosis not present

## 2020-09-04 DIAGNOSIS — M25562 Pain in left knee: Secondary | ICD-10-CM | POA: Diagnosis not present

## 2020-09-04 DIAGNOSIS — G8929 Other chronic pain: Secondary | ICD-10-CM | POA: Diagnosis not present

## 2020-09-04 DIAGNOSIS — R7301 Impaired fasting glucose: Secondary | ICD-10-CM | POA: Diagnosis not present

## 2020-09-04 DIAGNOSIS — M25511 Pain in right shoulder: Secondary | ICD-10-CM | POA: Diagnosis not present

## 2020-09-10 DIAGNOSIS — R200 Anesthesia of skin: Secondary | ICD-10-CM | POA: Diagnosis not present

## 2020-09-10 DIAGNOSIS — M25562 Pain in left knee: Secondary | ICD-10-CM | POA: Diagnosis not present

## 2020-09-10 DIAGNOSIS — M545 Low back pain, unspecified: Secondary | ICD-10-CM | POA: Diagnosis not present

## 2020-09-10 DIAGNOSIS — M5416 Radiculopathy, lumbar region: Secondary | ICD-10-CM | POA: Diagnosis not present

## 2020-09-10 DIAGNOSIS — M1712 Unilateral primary osteoarthritis, left knee: Secondary | ICD-10-CM | POA: Diagnosis not present

## 2020-09-10 DIAGNOSIS — M25511 Pain in right shoulder: Secondary | ICD-10-CM | POA: Diagnosis not present

## 2020-10-29 DIAGNOSIS — G8929 Other chronic pain: Secondary | ICD-10-CM | POA: Diagnosis not present

## 2020-10-29 DIAGNOSIS — M25511 Pain in right shoulder: Secondary | ICD-10-CM | POA: Diagnosis not present

## 2020-10-29 DIAGNOSIS — M778 Other enthesopathies, not elsewhere classified: Secondary | ICD-10-CM | POA: Diagnosis not present

## 2020-11-06 DIAGNOSIS — M25511 Pain in right shoulder: Secondary | ICD-10-CM | POA: Diagnosis not present

## 2020-11-06 DIAGNOSIS — M67813 Other specified disorders of tendon, right shoulder: Secondary | ICD-10-CM | POA: Diagnosis not present

## 2020-11-06 DIAGNOSIS — M778 Other enthesopathies, not elsewhere classified: Secondary | ICD-10-CM | POA: Diagnosis not present

## 2020-11-06 DIAGNOSIS — M19011 Primary osteoarthritis, right shoulder: Secondary | ICD-10-CM | POA: Diagnosis not present

## 2020-11-09 DIAGNOSIS — G8929 Other chronic pain: Secondary | ICD-10-CM | POA: Diagnosis not present

## 2020-11-09 DIAGNOSIS — M542 Cervicalgia: Secondary | ICD-10-CM | POA: Diagnosis not present

## 2020-11-09 DIAGNOSIS — M25511 Pain in right shoulder: Secondary | ICD-10-CM | POA: Diagnosis not present

## 2020-11-09 DIAGNOSIS — M5412 Radiculopathy, cervical region: Secondary | ICD-10-CM | POA: Diagnosis not present

## 2020-11-20 DIAGNOSIS — G8929 Other chronic pain: Secondary | ICD-10-CM | POA: Diagnosis not present

## 2020-11-20 DIAGNOSIS — M5412 Radiculopathy, cervical region: Secondary | ICD-10-CM | POA: Diagnosis not present

## 2020-11-20 DIAGNOSIS — M50223 Other cervical disc displacement at C6-C7 level: Secondary | ICD-10-CM | POA: Diagnosis not present

## 2020-11-20 DIAGNOSIS — M542 Cervicalgia: Secondary | ICD-10-CM | POA: Diagnosis not present

## 2020-11-20 DIAGNOSIS — M25511 Pain in right shoulder: Secondary | ICD-10-CM | POA: Diagnosis not present

## 2020-11-20 DIAGNOSIS — M47812 Spondylosis without myelopathy or radiculopathy, cervical region: Secondary | ICD-10-CM | POA: Diagnosis not present

## 2020-12-07 DIAGNOSIS — M5412 Radiculopathy, cervical region: Secondary | ICD-10-CM | POA: Diagnosis not present

## 2021-01-04 DIAGNOSIS — M25511 Pain in right shoulder: Secondary | ICD-10-CM | POA: Diagnosis not present

## 2021-01-04 DIAGNOSIS — G8929 Other chronic pain: Secondary | ICD-10-CM | POA: Diagnosis not present

## 2021-01-04 DIAGNOSIS — M4802 Spinal stenosis, cervical region: Secondary | ICD-10-CM | POA: Diagnosis not present

## 2021-01-07 ENCOUNTER — Ambulatory Visit: Payer: PPO | Admitting: Adult Health

## 2021-01-17 DIAGNOSIS — G8929 Other chronic pain: Secondary | ICD-10-CM | POA: Diagnosis not present

## 2021-01-17 DIAGNOSIS — M4802 Spinal stenosis, cervical region: Secondary | ICD-10-CM | POA: Diagnosis not present

## 2021-01-17 DIAGNOSIS — M25511 Pain in right shoulder: Secondary | ICD-10-CM | POA: Diagnosis not present

## 2021-01-17 DIAGNOSIS — Z789 Other specified health status: Secondary | ICD-10-CM | POA: Diagnosis not present

## 2021-01-17 DIAGNOSIS — M25611 Stiffness of right shoulder, not elsewhere classified: Secondary | ICD-10-CM | POA: Diagnosis not present

## 2021-01-17 DIAGNOSIS — M5380 Other specified dorsopathies, site unspecified: Secondary | ICD-10-CM | POA: Diagnosis not present

## 2021-01-17 DIAGNOSIS — Z7409 Other reduced mobility: Secondary | ICD-10-CM | POA: Diagnosis not present

## 2021-01-24 DIAGNOSIS — M25511 Pain in right shoulder: Secondary | ICD-10-CM | POA: Diagnosis not present

## 2021-01-24 DIAGNOSIS — M25611 Stiffness of right shoulder, not elsewhere classified: Secondary | ICD-10-CM | POA: Diagnosis not present

## 2021-01-24 DIAGNOSIS — M5380 Other specified dorsopathies, site unspecified: Secondary | ICD-10-CM | POA: Diagnosis not present

## 2021-01-24 DIAGNOSIS — M4802 Spinal stenosis, cervical region: Secondary | ICD-10-CM | POA: Diagnosis not present

## 2021-01-24 DIAGNOSIS — Z7409 Other reduced mobility: Secondary | ICD-10-CM | POA: Diagnosis not present

## 2021-01-24 DIAGNOSIS — G8929 Other chronic pain: Secondary | ICD-10-CM | POA: Diagnosis not present

## 2021-01-24 DIAGNOSIS — Z789 Other specified health status: Secondary | ICD-10-CM | POA: Diagnosis not present

## 2021-01-31 DIAGNOSIS — G8929 Other chronic pain: Secondary | ICD-10-CM | POA: Diagnosis not present

## 2021-01-31 DIAGNOSIS — M4802 Spinal stenosis, cervical region: Secondary | ICD-10-CM | POA: Diagnosis not present

## 2021-01-31 DIAGNOSIS — Z7409 Other reduced mobility: Secondary | ICD-10-CM | POA: Diagnosis not present

## 2021-01-31 DIAGNOSIS — Z789 Other specified health status: Secondary | ICD-10-CM | POA: Diagnosis not present

## 2021-01-31 DIAGNOSIS — M25611 Stiffness of right shoulder, not elsewhere classified: Secondary | ICD-10-CM | POA: Diagnosis not present

## 2021-01-31 DIAGNOSIS — M5380 Other specified dorsopathies, site unspecified: Secondary | ICD-10-CM | POA: Diagnosis not present

## 2021-01-31 DIAGNOSIS — M25511 Pain in right shoulder: Secondary | ICD-10-CM | POA: Diagnosis not present

## 2021-03-07 DIAGNOSIS — R7301 Impaired fasting glucose: Secondary | ICD-10-CM | POA: Diagnosis not present

## 2021-03-07 DIAGNOSIS — M25511 Pain in right shoulder: Secondary | ICD-10-CM | POA: Diagnosis not present

## 2021-03-07 DIAGNOSIS — G8929 Other chronic pain: Secondary | ICD-10-CM | POA: Diagnosis not present

## 2021-03-07 DIAGNOSIS — F5101 Primary insomnia: Secondary | ICD-10-CM | POA: Diagnosis not present

## 2021-03-07 DIAGNOSIS — I10 Essential (primary) hypertension: Secondary | ICD-10-CM | POA: Diagnosis not present

## 2021-03-07 DIAGNOSIS — E7800 Pure hypercholesterolemia, unspecified: Secondary | ICD-10-CM | POA: Diagnosis not present

## 2021-03-07 DIAGNOSIS — M791 Myalgia, unspecified site: Secondary | ICD-10-CM | POA: Diagnosis not present

## 2021-04-09 DIAGNOSIS — R051 Acute cough: Secondary | ICD-10-CM | POA: Diagnosis not present

## 2021-04-09 DIAGNOSIS — R509 Fever, unspecified: Secondary | ICD-10-CM | POA: Diagnosis not present

## 2021-04-09 DIAGNOSIS — U071 COVID-19: Secondary | ICD-10-CM | POA: Diagnosis not present

## 2021-05-17 IMAGING — MR MR HEAD W/O CM
7 of 11 series · 25 of 48 positions shown · non-contrast
Comparison: None.

CLINICAL DATA: Acute neurologic deficit

EXAM:
MRI HEAD WITHOUT CONTRAST
TECHNIQUE: Multiplanar, multiecho pulse sequences of the brain and surrounding
structures were obtained without intravenous contrast.

[Series 2: DWI · axial · 3.0mm · 0.94mm/px · z∈[-74,+74]mm · 7 of 104 slices shown (1 of 2)]
[im 1/104]
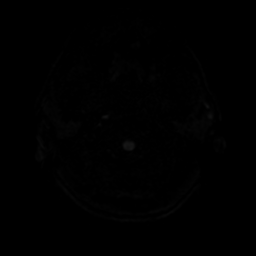
[im 18/104]
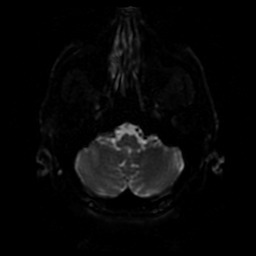
[im 35/104]
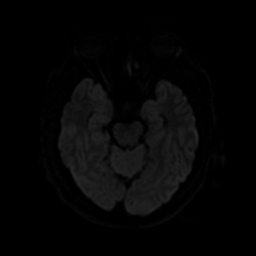
[im 52/104]
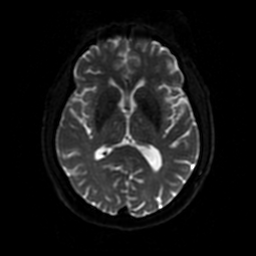
[im 69/104]
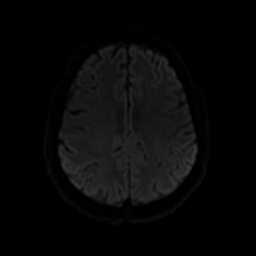
[im 86/104]
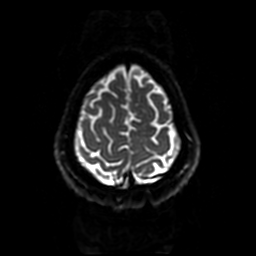
[im 104/104]
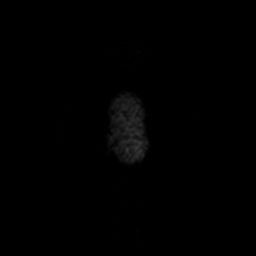

[Series 3: DWI · coronal · 4.0mm · 0.94mm/px · 6 of 74 slices shown (2 of 2)]
[im 1/74]
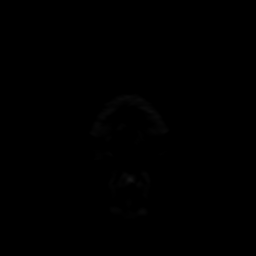
[im 15/74]
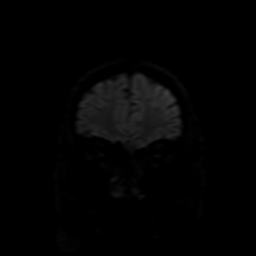
[im 30/74]
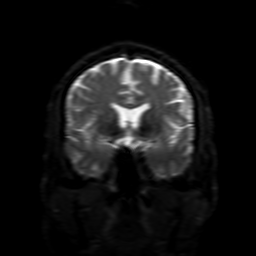
[im 44/74]
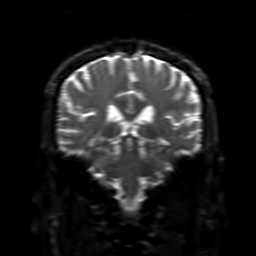
[im 59/74]
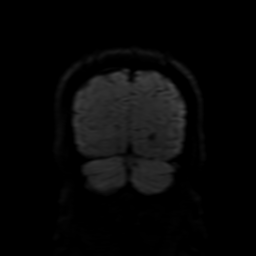
[im 74/74]
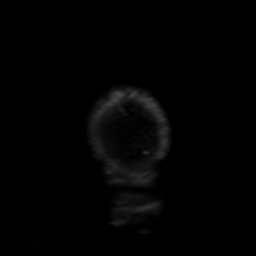

[Series 4: FLAIR · sagittal · 5.0mm · 0.23mm/px · 2 of 26 slices shown (1 of 2)]
[im 1/26]
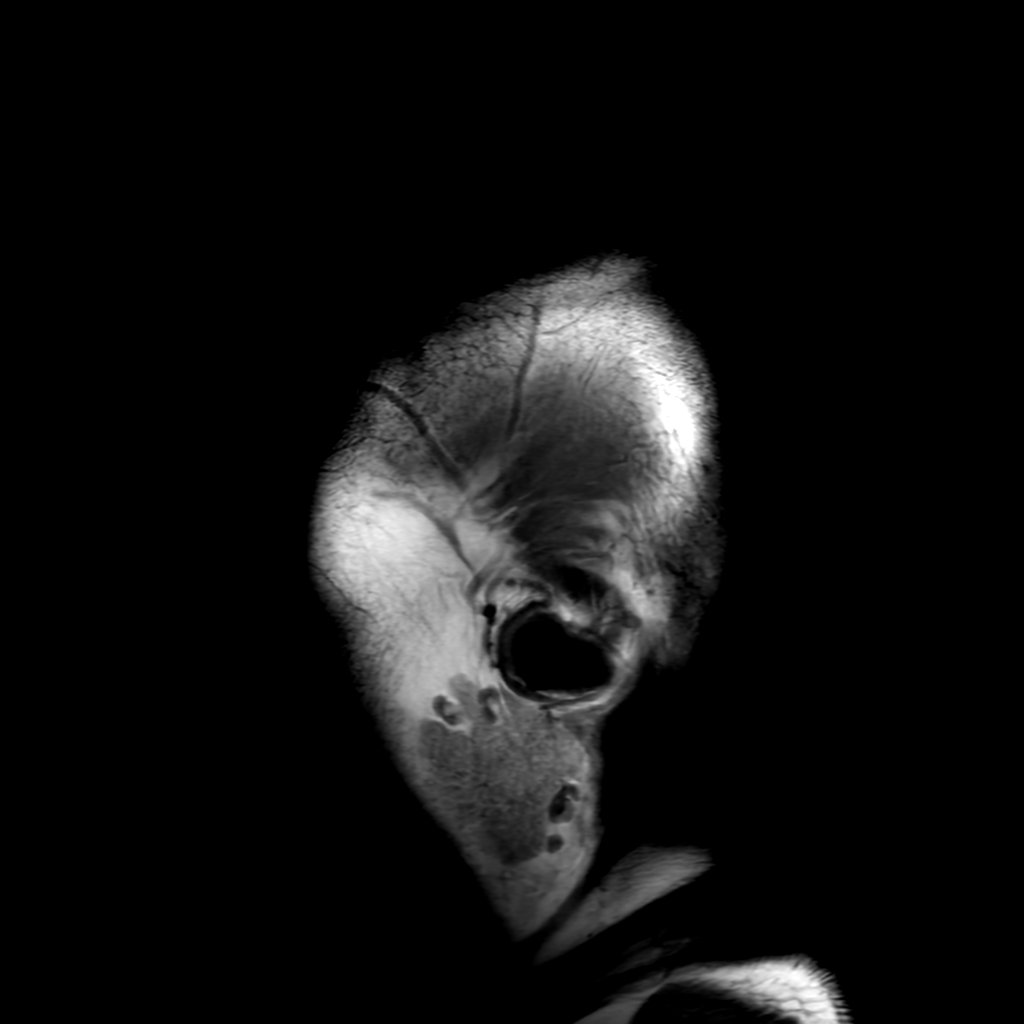
[im 26/26]
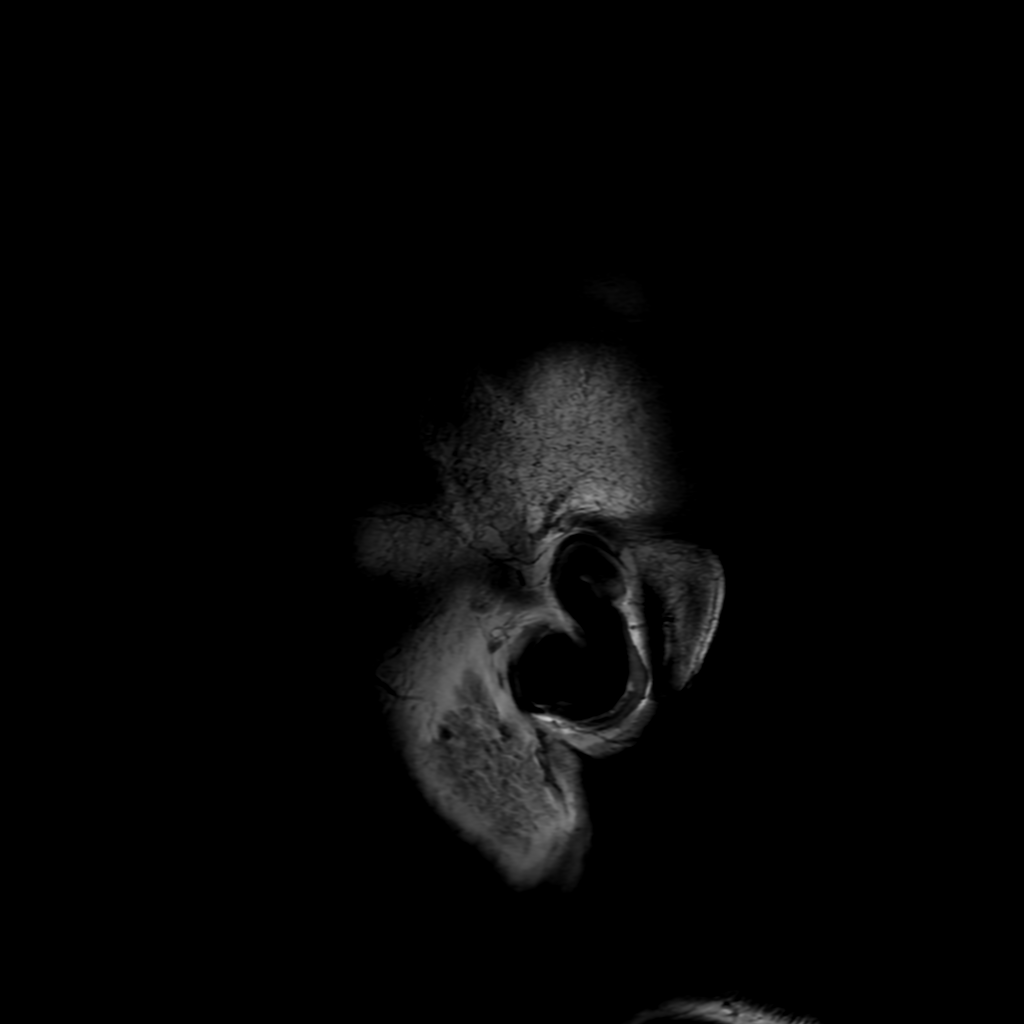

[Series 5: T2 · axial · 5.0mm · 0.23mm/px · 1 of 26 slices shown]
[im 1/26]
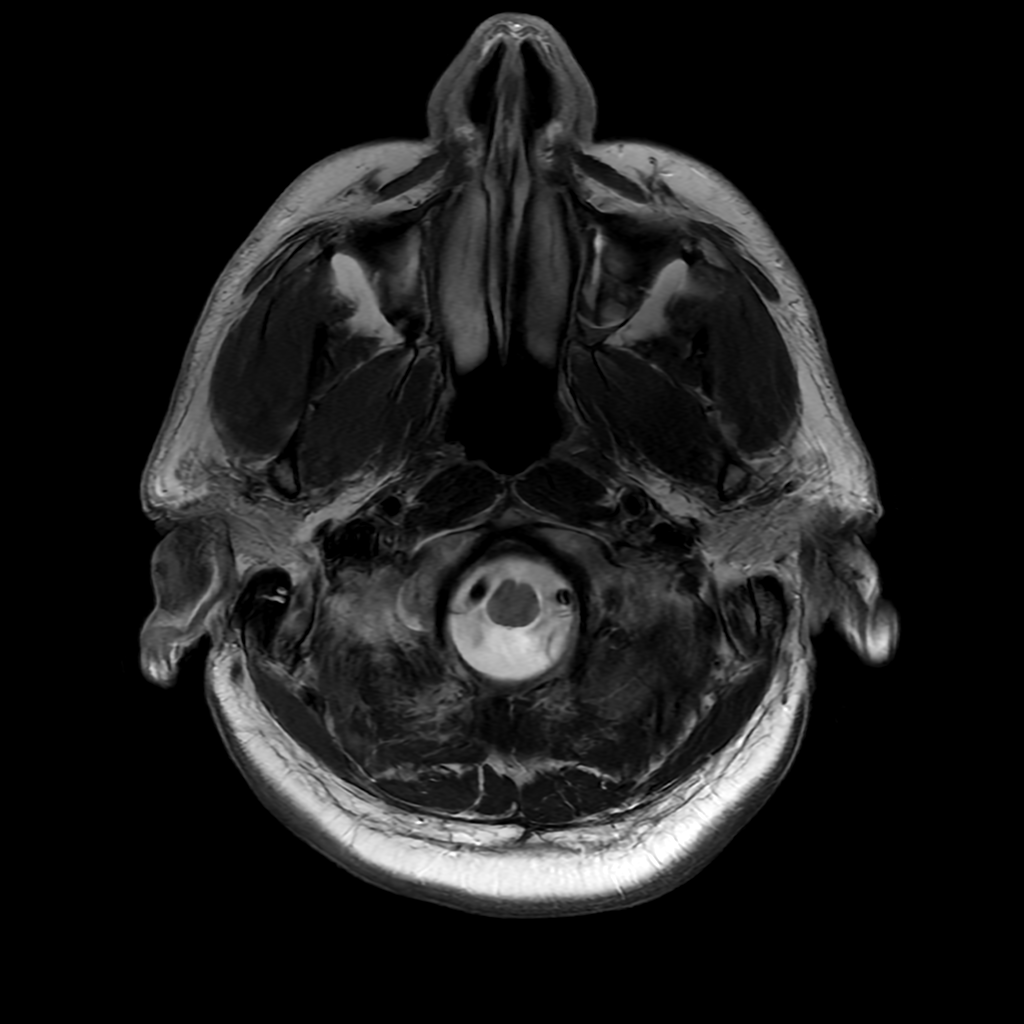

[Series 6: FLAIR · axial · 3.0mm · 0.45mm/px · z∈[-67,+78]mm · 2 of 26 slices shown (2 of 2)]
[im 1/26]
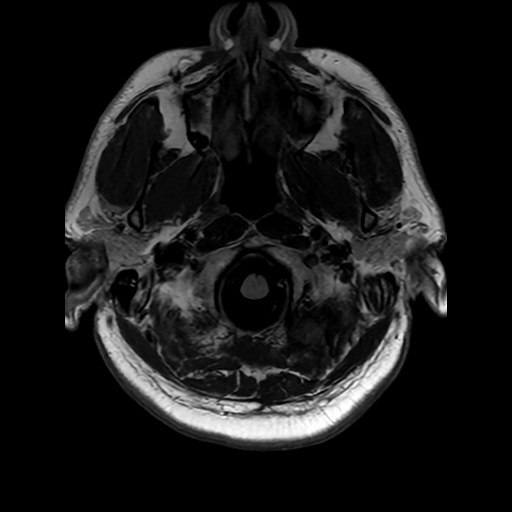
[im 26/26]
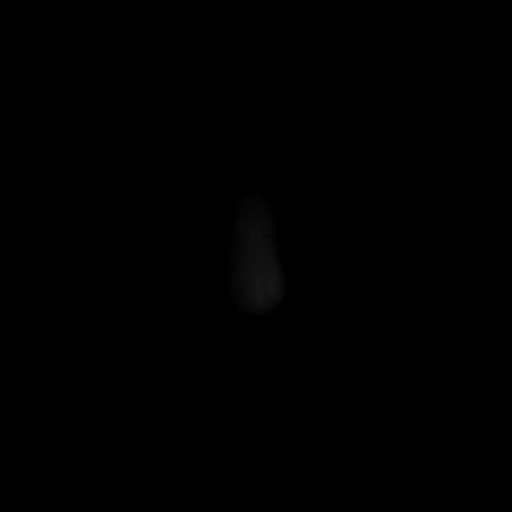

[Series 250: ADC · axial · 3.0mm · 0.94mm/px · z∈[-74,+74]mm · 4 of 52 slices shown (1 of 2)]
[im 1/52]
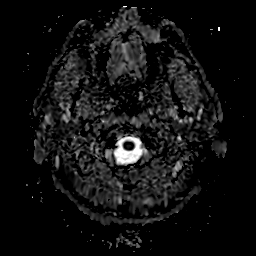
[im 18/52]
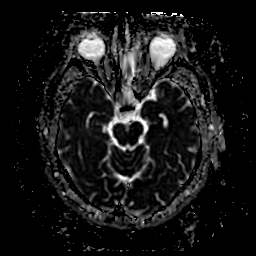
[im 35/52]
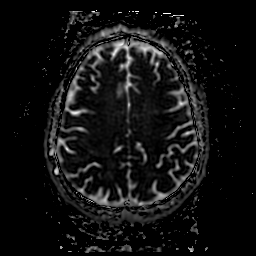
[im 52/52]
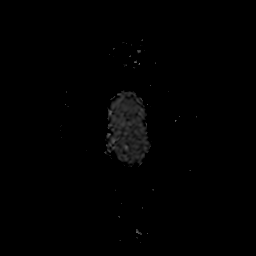

[Series 350: ADC · coronal · 4.0mm · 0.94mm/px · 3 of 37 slices shown (2 of 2)]
[im 1/37]
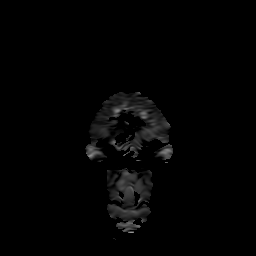
[im 19/37]
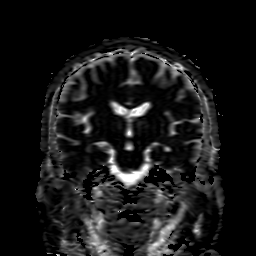
[im 37/37]
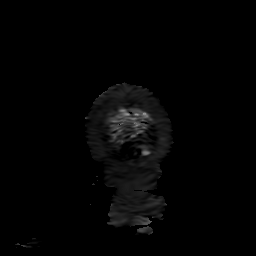

[25 of 48 positions shown; findings below may reference images not displayed]

FINDINGS: Brain: No acute infarct, mass effect or extra-axial collection.
Single focus of chronic microhemorrhage near the posterior right
lentiform nucleus. There is multifocal hyperintense T2-weighted
signal within the white matter. Parenchymal volume and CSF spaces
are normal. the midline structures are normal.

Vascular: Major flow voids are preserved.

Skull and upper cervical spine: Normal calvarium and skull base.
Visualized upper cervical spine and soft tissues are normal.

Sinuses/Orbits:No paranasal sinus fluid levels or advanced mucosal
thickening. No mastoid or middle ear effusion. Normal orbits.
IMPRESSION: 1. No acute intracranial abnormality.
2. White matter changes that may be associated with migraines or
chronic small vessel ischemia.

## 2021-08-06 ENCOUNTER — Telehealth: Payer: Self-pay

## 2021-08-06 NOTE — Telephone Encounter (Signed)
Spoke with the patient, they stated that they did not believe that the appointment was necessary, that they feel that they are doing fine.  ? ?Recall has been removed, and informed clinical staff.  ?
